# Patient Record
Sex: Male | Born: 1945 | Hispanic: Yes | Marital: Married | Smoking: Former smoker
Health system: Southern US, Community
[De-identification: ages and names within clinical notes are randomized; demographics above are authoritative.]

## PROBLEM LIST (undated history)

## (undated) DIAGNOSIS — I82409 Acute embolism and thrombosis of unspecified deep veins of unspecified lower extremity: Secondary | ICD-10-CM

## (undated) DIAGNOSIS — I714 Abdominal aortic aneurysm, without rupture, unspecified: Secondary | ICD-10-CM

## (undated) DIAGNOSIS — E785 Hyperlipidemia, unspecified: Secondary | ICD-10-CM

## (undated) DIAGNOSIS — C649 Malignant neoplasm of unspecified kidney, except renal pelvis: Secondary | ICD-10-CM

## (undated) HISTORY — DX: Abdominal aortic aneurysm, without rupture, unspecified: I71.40

## (undated) HISTORY — PX: LAPAROSCOPIC PARTIAL NEPHRECTOMY: SUR782

## (undated) HISTORY — DX: Hyperlipidemia, unspecified: E78.5

## (undated) HISTORY — DX: Acute embolism and thrombosis of unspecified deep veins of unspecified lower extremity: I82.409

## (undated) HISTORY — DX: Abdominal aortic aneurysm, without rupture: I71.4

## (undated) HISTORY — DX: Malignant neoplasm of unspecified kidney, except renal pelvis: C64.9

---

## 2017-01-31 ENCOUNTER — Encounter (HOSPITAL_COMMUNITY): Payer: Self-pay | Admitting: Emergency Medicine

## 2017-01-31 ENCOUNTER — Emergency Department (HOSPITAL_COMMUNITY)
Admission: EM | Admit: 2017-01-31 | Discharge: 2017-02-01 | Disposition: A | Discharge status: Home / Self Care | Payer: Medicare (Managed Care) | Attending: Physician Assistant | Admitting: Physician Assistant

## 2017-01-31 DIAGNOSIS — I10 Essential (primary) hypertension: Secondary | ICD-10-CM | POA: Diagnosis not present

## 2017-01-31 NOTE — ED Triage Notes (Signed)
Patient states that he has had episodes of hypertension in the past, states that he had to pick his wife off the floor this morning and he was afraid that his blood pressure has gotten high again.  He states that he wants it checked just in case.

## 2017-01-31 NOTE — ED Provider Notes (Signed)
[facility] DEPT Provider Note   CSN: [id number] Arrival date & time: 01/31/17  1850     History   Chief Complaint Chief Complaint  Patient presents with  . Hypertension    HPI Danny Torres is a 71 y.o. male.  HPI   Patient 71 year old male presenting with concerns about his blood pressure. Patient is here with his wife. He asked to have his blood pressure pressure checked in order to do so they checked him in as a patient. Patient is concerned because he has high blood pressure and he felt like he was lifting earlier today and wanted to check his blood pressure. Patient has an appointment at [facility] on Wednesday. He has no symptoms no chest pain no headaches NO need for work up for asymptomatic htn at this time.   History reviewed. No pertinent past medical history.  There are no active problems to display for this patient.   History reviewed. No pertinent surgical history.     Home Medications    Prior to Admission medications   Not on File    Family History No family history on file.  Social History Social History  Substance Use Topics  . Smoking status: Never Smoker  . Smokeless tobacco: Never Used  . Alcohol use No     Allergies   Patient has no known allergies.   Review of Systems Review of Systems  Constitutional: Negative for activity change.  Respiratory: Negative for shortness of breath.   Cardiovascular: Negative for chest pain.  Gastrointestinal: Negative for abdominal pain.     Physical Exam Updated Vital Signs BP (!) 151/71 (BP Location: Left Arm)   Pulse 65   Temp 97.7 F (36.5 C) (Oral)   Resp 18   Ht 5\' 11"  (1.803 m)   Wt 86.2 kg (190 lb)   SpO2 100%   BMI 26.50 kg/m   Physical Exam  Constitutional: He is oriented to person, place, and time. He appears well-nourished.  HENT:  Head: Normocephalic.  Eyes: Conjunctivae are normal.  Cardiovascular: Normal rate and regular rhythm.   No murmur heard. Pulmonary/Chest:  Effort normal and breath sounds normal. No respiratory distress. He has no wheezes.  Neurological: He is oriented to person, place, and time.  Skin: Skin is warm and dry. He is not diaphoretic.  Psychiatric: He has a normal mood and affect. His behavior is normal.     ED Treatments / Results  Labs (all labs ordered are listed, but only abnormal results are displayed) Labs Reviewed - No data to display  EKG  EKG Interpretation None       Radiology No results found.  Procedures Procedures (including critical care time)  Medications Ordered in ED Medications - No data to display   Initial Impression / Assessment and Plan / ED Course  I have reviewed the triage vital signs and the nursing notes.  Pertinent labs & imaging results that were available during my care of the patient were reviewed by me and considered in my medical decision making (see chart for details).     Patient 71 year old male presenting with concerns about his blood pressure. Patient is here with his wife. He asked to have his blood pressure pressure checked in order to do so they checked him in as a patient. Patient is concerned because he has high blood pressure and he felt like he was lifting earlier today and wanted to check his blood pressure. Patient has an appointment at [facility] on Wednesday. He  has no symptoms no chest pain no headaches NO need for work up for asymptomatic htn at this time.   Final Clinical Impressions(s) / ED Diagnoses   Final diagnoses:  None    New Prescriptions New Prescriptions   No medications on file     Macarthur Critchley, MD 01/31/17 2200

## 2017-02-01 NOTE — Discharge Instructions (Signed)
Please follow up with the [facility] as planned. Return with chest pain, headache or other concerns.

## 2017-04-22 ENCOUNTER — Other Ambulatory Visit (INDEPENDENT_AMBULATORY_CARE_PROVIDER_SITE_OTHER): Payer: Medicare Other

## 2017-04-22 ENCOUNTER — Ambulatory Visit (INDEPENDENT_AMBULATORY_CARE_PROVIDER_SITE_OTHER): Payer: Medicare Other | Admitting: Pulmonary Disease

## 2017-04-22 ENCOUNTER — Encounter: Payer: Self-pay | Admitting: Pulmonary Disease

## 2017-04-22 VITALS — BP 140/84 | HR 74 | Ht 69.0 in | Wt 196.0 lb

## 2017-04-22 DIAGNOSIS — R918 Other nonspecific abnormal finding of lung field: Secondary | ICD-10-CM | POA: Insufficient documentation

## 2017-04-22 DIAGNOSIS — R0600 Dyspnea, unspecified: Secondary | ICD-10-CM | POA: Diagnosis not present

## 2017-04-22 DIAGNOSIS — R0602 Shortness of breath: Secondary | ICD-10-CM

## 2017-04-22 LAB — CBC WITH DIFFERENTIAL/PLATELET
BASOS ABS: 0 10*3/uL (ref 0.0–0.1)
BASOS PCT: 0.7 % (ref 0.0–3.0)
EOS ABS: 0.2 10*3/uL (ref 0.0–0.7)
Eosinophils Relative: 2.3 % (ref 0.0–5.0)
HEMATOCRIT: 43.2 % (ref 39.0–52.0)
HEMOGLOBIN: 14.6 g/dL (ref 13.0–17.0)
LYMPHS PCT: 25.9 % (ref 12.0–46.0)
Lymphs Abs: 1.9 10*3/uL (ref 0.7–4.0)
MCHC: 33.7 g/dL (ref 30.0–36.0)
MCV: 88.8 fl (ref 78.0–100.0)
MONO ABS: 0.6 10*3/uL (ref 0.1–1.0)
Monocytes Relative: 7.7 % (ref 3.0–12.0)
NEUTROS ABS: 4.7 10*3/uL (ref 1.4–7.7)
Neutrophils Relative %: 63.4 % (ref 43.0–77.0)
PLATELETS: 259 10*3/uL (ref 150.0–400.0)
RBC: 4.87 Mil/uL (ref 4.22–5.81)
RDW: 12.4 % (ref 11.5–15.5)
WBC: 7.4 10*3/uL (ref 4.0–10.5)

## 2017-04-22 LAB — NITRIC OXIDE: Nitric Oxide: 25

## 2017-04-22 NOTE — Patient Instructions (Signed)
We will schedule pulmonary function test Get CBC with differential and blood allergy profile Follow-up in 2-4 weeks for review and discuss further management.
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
## 2017-04-23 LAB — RESPIRATORY ALLERGY PROFILE REGION II ~~LOC~~
Allergen, A. alternata, m6: <0.1 kU/L
Allergen, Cedar tree, t12: <0.1 kU/L
Allergen, Comm Silver Birch, t9: <0.1 kU/L
Allergen, D pternoyssinus,d7: 0.13 kU/L — ABNORMAL HIGH
Allergen, Mulberry, t76: <0.1 kU/L
Allergen, P. notatum, m1: 0.11 kU/L — ABNORMAL HIGH
Aspergillus fumigatus, m3: <0.1 kU/L
CLASS: 0
CLASS: 0
CLASS: 0
CLASS: 0
CLASS: 0
CLASS: 0
CLASS: 0
CLASS: 0
CLASS: 0
CLASS: 0
CLASS: 0
Cat Dander: <0.1 kU/L
Class: 0
Class: 0
Class: 0
Class: 0
Class: 0
Class: 0
Class: 0
Class: 0
Class: 0
Class: 0
Class: 0
Class: 0
Class: 0
D. farinae: <0.1 kU/L
IgE (Immunoglobulin E), Serum: 42 kU/L (ref <=114)
Pecan/Hickory Tree IgE: <0.1 kU/L
Rough Pigweed  IgE: <0.1 kU/L

## 2017-04-23 LAB — INTERPRETATION:

## 2017-04-23 NOTE — Addendum Note (Signed)
Addended by: Maryanna Shape A on: 04/23/2017 10:54 AM   Modules accepted: Orders

## 2017-07-05 ENCOUNTER — Ambulatory Visit: Payer: Medicare Other | Admitting: Pulmonary Disease

## 2017-09-28 ENCOUNTER — Encounter: Payer: Self-pay | Admitting: Pulmonary Disease

## 2017-09-28 ENCOUNTER — Ambulatory Visit: Payer: Medicare Other | Admitting: Pulmonary Disease

## 2017-09-28 VITALS — BP 120/64 | HR 77 | Ht 70.0 in | Wt 197.8 lb

## 2017-09-28 DIAGNOSIS — R0600 Dyspnea, unspecified: Secondary | ICD-10-CM

## 2017-09-28 DIAGNOSIS — R918 Other nonspecific abnormal finding of lung field: Secondary | ICD-10-CM | POA: Diagnosis not present

## 2017-09-28 MED ORDER — UMECLIDINIUM-VILANTEROL 62.5-25 MCG/INH IN AEPB: 1.0000 | INHALATION_SPRAY | Freq: Every day | RESPIRATORY_TRACT | 5 refills | Status: DC

## 2017-09-28 MED ORDER — UMECLIDINIUM-VILANTEROL 62.5-25 MCG/INH IN AEPB: 1.0000 | INHALATION_SPRAY | Freq: Every day | RESPIRATORY_TRACT | 0 refills | Status: AC
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
## 2017-09-28 NOTE — Patient Instructions (Signed)
I have reviewed your last CT scan.  It shows small lung nodules which are likely benign We will follow-up with a repeat CT in 6 months Please see if he can get your CT scan from the [place] on a desk for me to review We will order pulmonary function test, give Anoro inhaler.  Please let us know if this works for you Follow-up in 1-2 months

## 2017-11-03 ENCOUNTER — Ambulatory Visit (INDEPENDENT_AMBULATORY_CARE_PROVIDER_SITE_OTHER): Payer: Medicare Other | Admitting: Pulmonary Disease

## 2017-11-03 ENCOUNTER — Encounter: Payer: Self-pay | Admitting: Pulmonary Disease

## 2017-11-03 VITALS — BP 136/80 | HR 71 | Ht 70.0 in | Wt 194.0 lb

## 2017-11-03 DIAGNOSIS — R0602 Shortness of breath: Secondary | ICD-10-CM | POA: Diagnosis not present

## 2017-11-03 DIAGNOSIS — R0600 Dyspnea, unspecified: Secondary | ICD-10-CM

## 2017-11-03 DIAGNOSIS — R918 Other nonspecific abnormal finding of lung field: Secondary | ICD-10-CM

## 2017-11-03 LAB — PULMONARY FUNCTION TEST
DL/VA % pred: 89 %
DL/VA: 4.14 ml/min/mmHg/L
DLCO UNC % PRED: 84 %
DLCO UNC: 27.24 ml/min/mmHg
FEF 25-75 POST: 3.59 L/s
FEF 25-75 Pre: 2.63 L/sec
FEF2575-%Change-Post: 36 %
FEF2575-%Pred-Post: 144 %
FEF2575-%Pred-Pre: 105 %
FEV1-%CHANGE-POST: 8 %
FEV1-%PRED-POST: 88 %
FEV1-%Pred-Pre: 81 %
FEV1-POST: 2.91 L
FEV1-Pre: 2.68 L
FEV1FVC-%Change-Post: 3 %
FEV1FVC-%Pred-Pre: 106 %
FEV6-%Change-Post: 3 %
FEV6-%PRED-PRE: 80 %
FEV6-%Pred-Post: 83 %
FEV6-POST: 3.52 L
FEV6-Pre: 3.39 L
FEV6FVC-%Change-Post: 0 %
FEV6FVC-%PRED-POST: 105 %
FEV6FVC-%Pred-Pre: 106 %
FVC-%CHANGE-POST: 4 %
FVC-%PRED-POST: 79 %
FVC-%PRED-PRE: 76 %
FVC-POST: 3.54 L
FVC-PRE: 3.39 L
POST FEV6/FVC RATIO: 99 %
PRE FEV6/FVC RATIO: 100 %
Post FEV1/FVC ratio: 82 %
Pre FEV1/FVC ratio: 79 %
RV % pred: 110 %
RV: 2.69 L
TLC % pred: 113 %
TLC: 7.99 L

## 2017-11-03 NOTE — Addendum Note (Signed)
Addended by: Maryanna Shape A on: 11/03/2017 11:32 AM   Modules accepted: Orders
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
## 2017-11-03 NOTE — Progress Notes (Signed)
PFT completed 11/03/17  

## 2017-11-03 NOTE — Patient Instructions (Signed)
I have reviewed your PFTs today.  They look okay with no evidence of COPD. I am glad the Anoro is helping.  We will continue the same inhaler once a day We will schedule you for follow-up CT without contrast for lung nodule follow-up in end of September 2019 I will see her back in clinic after CT scan.

## 2018-03-14 ENCOUNTER — Ambulatory Visit (INDEPENDENT_AMBULATORY_CARE_PROVIDER_SITE_OTHER)
Admission: RE | Admit: 2018-03-14 | Discharge: 2018-03-14 | Disposition: A | Discharge status: Home / Self Care | Payer: Medicare Other | Source: Ambulatory Visit | Attending: Pulmonary Disease | Admitting: Pulmonary Disease

## 2018-03-14 DIAGNOSIS — R918 Other nonspecific abnormal finding of lung field: Secondary | ICD-10-CM

## 2018-03-24 ENCOUNTER — Ambulatory Visit: Payer: Medicare Other | Admitting: Pulmonary Disease

## 2018-03-24 ENCOUNTER — Encounter: Payer: Self-pay | Admitting: Pulmonary Disease

## 2018-03-24 VITALS — BP 128/68 | HR 69 | Ht 70.0 in | Wt 194.0 lb

## 2018-03-24 DIAGNOSIS — R918 Other nonspecific abnormal finding of lung field: Secondary | ICD-10-CM

## 2018-03-24 DIAGNOSIS — R0600 Dyspnea, unspecified: Secondary | ICD-10-CM

## 2018-03-24 DIAGNOSIS — Z23 Encounter for immunization: Secondary | ICD-10-CM

## 2018-03-24 NOTE — Progress Notes (Signed)
Inhaler training given. In-check peak flow #:55

## 2018-03-24 NOTE — Patient Instructions (Signed)
I have reviewed your most recent CT.  It shows that the inflammatory opacities from the [facility] have resolved There are small calcified lung nodules that are likely benign We will order a follow-up CT without contrast in 2 years time Follow back with you in 2 years for review of scan  I am glad that your breathing has improved It is okay to stop the Anoro inhaler If there is any worsening of symptoms give [place] a call back and we will see you sooner
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
## 2018-08-15 ENCOUNTER — Other Ambulatory Visit: Payer: Self-pay | Admitting: Family Medicine

## 2018-08-15 DIAGNOSIS — C641 Malignant neoplasm of right kidney, except renal pelvis: Secondary | ICD-10-CM

## 2018-08-26 ENCOUNTER — Ambulatory Visit
Admission: RE | Admit: 2018-08-26 | Discharge: 2018-08-26 | Disposition: A | Discharge status: Home / Self Care | Payer: Medicare Other | Source: Ambulatory Visit | Attending: Family Medicine | Admitting: Family Medicine

## 2018-08-26 DIAGNOSIS — C641 Malignant neoplasm of right kidney, except renal pelvis: Secondary | ICD-10-CM

## 2018-08-26 MED ORDER — IOPAMIDOL (ISOVUE-300) INJECTION 61%
100.0000 mL | Freq: Once | INTRAVENOUS | Status: AC | PRN
  Administered 2018-08-26: 100 mL via INTRAVENOUS

## 2019-08-12 ENCOUNTER — Ambulatory Visit: Payer: Medicare Other | Attending: Internal Medicine

## 2019-08-12 DIAGNOSIS — Z23 Encounter for immunization: Secondary | ICD-10-CM | POA: Insufficient documentation

## 2019-08-12 NOTE — Progress Notes (Signed)
   Covid-19 Vaccination Clinic  Name:  Danny Torres    MRN: [id number] DOB: January 20, 1946  08/12/2019  Mr. Danny Torres was observed post Covid-19 immunization for 15 minutes without incidence. He was provided with Vaccine Information Sheet and instruction to access the V-Safe system.   Mr. Danny Torres was instructed to call 911 with any severe reactions post vaccine: Marland Kitchen Difficulty breathing  . Swelling of your face and throat  . A fast heartbeat  . A bad rash all over your body  . Dizziness and weakness    Immunizations Administered    Name Date Dose VIS Date Route   Pfizer COVID-19 Vaccine 08/12/2019 10:37 AM 0.3 mL 06/02/2019 Intramuscular   Manufacturer: [place]   Lot: [id number]   [place]: [id number]

## 2019-09-05 ENCOUNTER — Ambulatory Visit: Payer: Medicare Other | Attending: Internal Medicine

## 2019-09-05 DIAGNOSIS — Z23 Encounter for immunization: Secondary | ICD-10-CM

## 2019-09-05 NOTE — Progress Notes (Signed)
   Covid-19 Vaccination Clinic  Name:  Jakobee Snyder    MRN: [id number] DOB: Jan 12, 1946  09/05/2019  Mr. Essey Bressler was observed post Covid-19 immunization for 15 minutes without incident. He was provided with Vaccine Information Sheet and instruction to access the V-Safe system.   Mr. Fines Suhre was instructed to call 911 with any severe reactions post vaccine: Marland Kitchen Difficulty breathing  . Swelling of face and throat  . A fast heartbeat  . A bad rash all over body  . Dizziness and weakness   Immunizations Administered    Name Date Dose VIS Date Route   Pfizer COVID-19 Vaccine 09/05/2019 11:10 AM 0.3 mL 06/02/2019 Intramuscular   Manufacturer: [place]   Lot: [id number]   [place]: [id number]

## 2019-09-18 ENCOUNTER — Other Ambulatory Visit: Payer: Self-pay | Admitting: Family Medicine

## 2019-09-18 DIAGNOSIS — C641 Malignant neoplasm of right kidney, except renal pelvis: Secondary | ICD-10-CM

## 2019-10-09 ENCOUNTER — Other Ambulatory Visit: Payer: Self-pay | Admitting: Family Medicine

## 2019-10-09 DIAGNOSIS — C641 Malignant neoplasm of right kidney, except renal pelvis: Secondary | ICD-10-CM

## 2019-10-13 ENCOUNTER — Ambulatory Visit
Admission: RE | Admit: 2019-10-13 | Discharge: 2019-10-13 | Disposition: A | Discharge status: Home / Self Care | Payer: Medicare Other | Source: Ambulatory Visit | Attending: Family Medicine | Admitting: Family Medicine

## 2019-10-13 ENCOUNTER — Other Ambulatory Visit: Payer: Medicare Other

## 2019-10-13 ENCOUNTER — Other Ambulatory Visit: Payer: Self-pay

## 2019-10-13 DIAGNOSIS — C641 Malignant neoplasm of right kidney, except renal pelvis: Secondary | ICD-10-CM

## 2019-10-13 MED ORDER — IOPAMIDOL (ISOVUE-300) INJECTION 61%
100.0000 mL | Freq: Once | INTRAVENOUS | Status: AC | PRN
  Administered 2019-10-13: 100 mL via INTRAVENOUS

## 2020-02-13 ENCOUNTER — Ambulatory Visit: Payer: Medicare Other | Admitting: Pulmonary Disease

## 2020-02-13 ENCOUNTER — Other Ambulatory Visit: Payer: Self-pay

## 2020-02-13 ENCOUNTER — Encounter: Payer: Self-pay | Admitting: Pulmonary Disease

## 2020-02-13 VITALS — BP 132/68 | HR 64 | Temp 97.3°F | Ht 71.0 in | Wt 176.0 lb

## 2020-02-13 DIAGNOSIS — R918 Other nonspecific abnormal finding of lung field: Secondary | ICD-10-CM | POA: Diagnosis not present

## 2020-02-13 NOTE — Patient Instructions (Signed)
We will get a CT chest without contrast for follow-up of lung nodules You will not need a follow-up appointment unless the CT is abnormal.
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
## 2020-02-13 NOTE — Addendum Note (Signed)
Addended by: Merrilee Seashore on: 02/13/2020 03:14 PM   Modules accepted: Orders

## 2020-02-21 ENCOUNTER — Other Ambulatory Visit: Payer: Medicare Other

## 2020-02-23 ENCOUNTER — Inpatient Hospital Stay: Admission: RE | Admit: 2020-02-23 | Payer: Medicare Other | Source: Ambulatory Visit

## 2020-03-21 ENCOUNTER — Other Ambulatory Visit: Payer: Self-pay

## 2020-03-21 ENCOUNTER — Ambulatory Visit: Payer: Medicare Other | Admitting: Cardiology

## 2020-03-21 ENCOUNTER — Encounter: Payer: Self-pay | Admitting: Cardiology

## 2020-03-21 VITALS — BP 136/64 | HR 70 | Ht 71.0 in | Wt 174.0 lb

## 2020-03-21 DIAGNOSIS — I7143 Infrarenal abdominal aortic aneurysm, without rupture: Secondary | ICD-10-CM

## 2020-03-21 DIAGNOSIS — I83812 Varicose veins of left lower extremities with pain: Secondary | ICD-10-CM

## 2020-03-21 DIAGNOSIS — M79605 Pain in left leg: Secondary | ICD-10-CM

## 2020-03-21 DIAGNOSIS — I8390 Asymptomatic varicose veins of unspecified lower extremity: Secondary | ICD-10-CM

## 2020-03-21 DIAGNOSIS — E7800 Pure hypercholesterolemia, unspecified: Secondary | ICD-10-CM

## 2020-03-21 DIAGNOSIS — Z87891 Personal history of nicotine dependence: Secondary | ICD-10-CM

## 2020-03-21 DIAGNOSIS — R9431 Abnormal electrocardiogram [ECG] [EKG]: Secondary | ICD-10-CM

## 2020-03-21 NOTE — Progress Notes (Signed)
Date:  03/21/2020   ID:  Danny Torres, DOB Sep 09, 1945, MRN [id number]  PCP:  Orpah Melter, MD  Cardiologist:  Rex Kras, DO, [facility] (established care 03/21/2020)  REASON FOR CONSULT: Leg Pain   REQUESTING PHYSICIAN:  Orpah Melter, [place],   [id number]  Chief Complaint  Patient presents with  . Leg Pain    HPI  Danny Torres is a 74 y.o. male who presents to the office with a chief complaint of " leg pain." Patient's past medical history and cardiovascular risk factors include: Hx of renal cancer s/p partial nephrectomy, aortic atherosclerosis, infrarenal AAA (seen on CT abdomen and pelvis 10/13/2019), hyperlipidemia, former smoker, advanced age.  He is referred to the office at the request of Orpah Melter, MD for evaluation of evaluation of leg pain.  Patient states that he has been having lower extremity leg pain bilaterally for some time.  The pain is worse on the left compared to the right.  Patient states that he has good functional capacity for age and walks 10 to 12 miles per week.  Very infrequently does he have pain with walking.  Majority of the pain in the bilateral lower extremity happens with prolonged immobilization such as sitting down to watch TV.  He has varicose veins on the left lower extremity that are present for the last several years.  And they are painful at times.  He has not had any vein work done in the past.  No known history of peripheral vascular disease.  He does have aortic atherosclerosis, history of smoking, and hyperlipidemia.  FUNCTIONAL STATUS: Walks 10-12 miles per week.    ALLERGIES: No Known Allergies  MEDICATION LIST PRIOR TO VISIT: Current Meds  Medication Sig  . Ascorbic Acid (VITAMIN C) 100 MG tablet Take 100 mg by mouth daily.  Marland Kitchen aspirin 81 MG tablet Take 81 mg by mouth daily.  Marland Kitchen buPROPion (WELLBUTRIN XL) 300 MG 24 hr tablet Take 300 mg by mouth daily.  . busPIRone (BUSPAR) 15  MG tablet Take 15 mg by mouth 3 (three) times daily.  Marland Kitchen donepezil (ARICEPT) 10 MG tablet Take 10 mg by mouth daily.  . Multiple Vitamin (MULTIVITAMIN) tablet Take 1 tablet by mouth daily.  . Omega-3 Fatty Acids (FISH OIL) 1000 MG CAPS Take by mouth.  . simvastatin (ZOCOR) 80 MG tablet Take 80 mg by mouth daily.  . temazepam (RESTORIL) 30 MG capsule Take 30 mg by mouth at bedtime.  . Zinc Sulfate (ZINC 15 PO) Take by mouth.     PAST MEDICAL HISTORY: Past Medical History:  Diagnosis Date  . AAA (abdominal aortic aneurysm) ([place])   . Cancer of kidney ([place])   . Hyperlipidemia     PAST SURGICAL HISTORY: Past Surgical History:  Procedure Laterality Date  . LAPAROSCOPIC PARTIAL NEPHRECTOMY Right     FAMILY HISTORY: The patient family history includes Colon cancer in his father; Skin cancer in his brother.  SOCIAL HISTORY:  The patient  reports that he has quit smoking. His smoking use included cigarettes. He has a 10.00 pack-year smoking history. He has never used smokeless tobacco. He reports that he does not drink alcohol and does not use drugs.  REVIEW OF SYSTEMS: Review of Systems  Constitutional: Negative for chills and fever.  HENT: Negative for hoarse voice and nosebleeds.   Eyes: Negative for discharge, double vision and pain.  Cardiovascular: Negative for chest pain, claudication, dyspnea on exertion, leg  swelling, near-syncope, orthopnea, palpitations, paroxysmal nocturnal dyspnea and syncope.       Leg pain.   Respiratory: Negative for hemoptysis and shortness of breath.   Musculoskeletal: Negative for muscle cramps and myalgias.  Gastrointestinal: Negative for abdominal pain, constipation, diarrhea, hematemesis, hematochezia, melena, nausea and vomiting.  Neurological: Negative for dizziness and light-headedness.   PHYSICAL EXAM: Vitals with BMI 03/21/2020 02/13/2020 03/24/2018  Height 5' 11"  5' 11"  5' 10"   Weight 174 lbs 176 lbs 194 lbs  BMI 24.28 34.19 62.22  Systolic  979 892 119  Diastolic 64 68 68  Pulse 70 64 69   CONSTITUTIONAL: Well-developed and well-nourished. No acute distress.  SKIN: Skin is warm and dry. No rash noted. No cyanosis. No pallor. No jaundice HEAD: Normocephalic and atraumatic.  EYES: No scleral icterus MOUTH/THROAT: Moist oral membranes.  NECK: No JVD present. No thyromegaly noted. No carotid bruits  LYMPHATIC: No visible cervical adenopathy.  CHEST Normal respiratory effort. No intercostal retractions  LUNGS: Clear to auscultation bilaterally.  No stridor. No wheezes. No rales.  CARDIOVASCULAR: Regular, positive S1-S2, no murmurs rubs or gallops appreciated. ABDOMINAL: Soft, nontender, nondistended, positive bowel sounds in all 4 quadrants, prior surgical scars well-healed.  No apparent ascites.  EXTREMITIES: No peripheral edema.  2+ bilateral dorsalis pedis and posterior tibial pulses.  +1 bilateral popliteal pulses.  Varicose veins noted on the left lower extremity. HEMATOLOGIC: No significant bruising NEUROLOGIC: Oriented to person, place, and time. Nonfocal. Normal muscle tone.  PSYCHIATRIC: Normal mood and affect. Normal behavior. Cooperative  CARDIAC DATABASE: EKG: 03/21/2020: Sinus  Rhythm, 72bpm, Left axis, LAFB, ST depressions in inferior leads, nonspecific T-abnormality. No prior studies for comparison.   Echocardiogram: No results found for this or any previous visit from the past 1095 days.   Stress Testing: No results found for this or any previous visit from the past 1095 days.  Heart Catheterization: None  CT Abdomen pelvis with contrast 10/13/2019: Stable 3 cm infrarenal abdominal aortic aneurysm. Recommend followup by ultrasound in 3 years.  LABORATORY DATA: External Labs: Collected: 09/15/2019 Creatinine 0.91 mg/dL. eGFR: 82 mL/min per 1.73 m Potassium 4.3 Lipid profile: Total cholesterol 125, triglycerides 80, HDL 40, LDL 88  IMPRESSION:    ICD-10-CM   1. Pain in both lower extremities   M79.604 EKG 12-Lead   M79.605 PCV LOWER ARTERIAL (BILATERAL)  2. Aneurysm of infrarenal abdominal aorta (HCC)  I71.4   3. Former smoker  Z87.891   4. Hypercholesterolemia  E78.00   5. Varicose veins of left lower extremity with pain  I83.812 PCV LOWER VENOUS US (BILATERAL)  6. Abnormal electrocardiogram  R94.31 PCV ECHOCARDIOGRAM COMPLETE    PCV MYOCARDIAL PERFUSION WO LEXISCAN    SARS-COV-2 RNA,(COVID-19) QUAL NAAT     RECOMMENDATIONS: Danny Torres is a 74 y.o. male whose past medical history and cardiac risk factors include: Hx of renal cancer s/p partial nephrectomy, aortic atherosclerosis, infrarenal AAA (seen on CT abdomen and pelvis 10/13/2019), hyperlipidemia, former smoker, advanced age.  Pain in bilateral lower extremities:  Patient does not have classic symptoms of claudication overall has good functional capacity for age.  But he has multiple cardiovascular risk factors for peripheral artery disease such as aortic atherosclerosis, former smoker, and hyperlipidemia.  We will check lower extremity arterial duplex to evaluate for peripheral vascular disease.  Varicose veins in the left lower extremity with pain:  Patient is already taking aspirin 81 mg p.o. daily and states that it helps with the discomfort.  Continue for now.  We will check lower extremity venous duplexes to evaluate for either superficial or deep venous thromboses and chronic venous insufficiency.  May consider vascular consultation based on the results for possible evaluation of venous ablation.  Infrarenal abdominal aortic aneurysm:  Findings noted on CT abdomen pelvis with contrast on October 13, 2019.  Recommend follow-up ultrasound in 3 years.  Patient is informed that he needs follow-up in 3 years.  Patient no longer smokes educated on importance of complete and continued smoking cessation.  Abnormal EKG:  Patient is noted to have normal sinus rhythm with ST depressions in the inferior  leads which are downsloping.  He has multiple cardiovascular risk factors as noted above.  He has not had a cardiac evaluation in the past and is a good exercise candidate.  Recommend an echocardiogram to evaluate for structural heart disease and LVEF and exercise nuclear stress test to evaluate for underlying functional capacity and reversible ischemia.  Mixed hyperlipidemia: Currently on statin therapy.  Most recent lipid profile reviewed.  Currently managed by primary care provider.  Former smoker: Educated on the importance of continued smoking cessation.  Independently reviewed external records provided by the patient's PCP and external labs.  Labs noted above for further reference.  FINAL MEDICATION LIST END OF ENCOUNTER: No orders of the defined types were placed in this encounter.   Current Outpatient Medications:  .  Ascorbic Acid (VITAMIN C) 100 MG tablet, Take 100 mg by mouth daily., Disp: , Rfl:  .  aspirin 81 MG tablet, Take 81 mg by mouth daily., Disp: , Rfl:  .  buPROPion (WELLBUTRIN XL) 300 MG 24 hr tablet, Take 300 mg by mouth daily., Disp: , Rfl:  .  busPIRone (BUSPAR) 15 MG tablet, Take 15 mg by mouth 3 (three) times daily., Disp: , Rfl:  .  donepezil (ARICEPT) 10 MG tablet, Take 10 mg by mouth daily., Disp: , Rfl:  .  Multiple Vitamin (MULTIVITAMIN) tablet, Take 1 tablet by mouth daily., Disp: , Rfl:  .  Omega-3 Fatty Acids (FISH OIL) 1000 MG CAPS, Take by mouth., Disp: , Rfl:  .  simvastatin (ZOCOR) 80 MG tablet, Take 80 mg by mouth daily., Disp: , Rfl:  .  temazepam (RESTORIL) 30 MG capsule, Take 30 mg by mouth at bedtime., Disp: , Rfl:  .  Zinc Sulfate (ZINC 15 PO), Take by mouth., Disp: , Rfl:   Orders Placed This Encounter  Procedures  . SARS-COV-2 RNA,(COVID-19) QUAL NAAT  . PCV MYOCARDIAL PERFUSION WO LEXISCAN  . EKG 12-Lead  . PCV ECHOCARDIOGRAM COMPLETE  . PCV LOWER VENOUS US (BILATERAL)  . PCV LOWER ARTERIAL (BILATERAL)    There are no Patient  Instructions on file for this visit.   --Continue cardiac medications as reconciled in final medication list. --Return in about 6 weeks (around 05/02/2020) for Reevaluation of symptoms and review test results. Or sooner if needed. --Continue follow-up with your primary care physician regarding the management of your other chronic comorbid conditions.  Patient's questions and concerns were addressed to his satisfaction. He voices understanding of the instructions provided during this encounter.   This note was created using a voice recognition software as a result there may be grammatical errors inadvertently enclosed that do not reflect the nature of this encounter. Every attempt is made to correct such errors.  Rex Kras, [place], [facility]  Pager: [phone] Office: [phone]

## 2020-03-25 ENCOUNTER — Ambulatory Visit (INDEPENDENT_AMBULATORY_CARE_PROVIDER_SITE_OTHER)
Admission: RE | Admit: 2020-03-25 | Discharge: 2020-03-25 | Disposition: A | Discharge status: Home / Self Care | Payer: Medicare Other | Source: Ambulatory Visit | Attending: Pulmonary Disease | Admitting: Pulmonary Disease

## 2020-03-25 ENCOUNTER — Other Ambulatory Visit: Payer: Self-pay

## 2020-03-25 DIAGNOSIS — R918 Other nonspecific abnormal finding of lung field: Secondary | ICD-10-CM | POA: Diagnosis not present

## 2020-03-26 ENCOUNTER — Other Ambulatory Visit: Payer: Self-pay

## 2020-03-26 DIAGNOSIS — M79604 Pain in right leg: Secondary | ICD-10-CM

## 2020-03-26 DIAGNOSIS — M79605 Pain in left leg: Secondary | ICD-10-CM

## 2020-03-26 DIAGNOSIS — R9431 Abnormal electrocardiogram [ECG] [EKG]: Secondary | ICD-10-CM

## 2020-03-26 DIAGNOSIS — I83812 Varicose veins of left lower extremities with pain: Secondary | ICD-10-CM

## 2020-03-27 ENCOUNTER — Other Ambulatory Visit: Payer: Self-pay

## 2020-03-27 ENCOUNTER — Ambulatory Visit: Payer: Medicare Other

## 2020-03-29 ENCOUNTER — Encounter: Payer: Self-pay | Admitting: *Deleted

## 2020-03-29 ENCOUNTER — Other Ambulatory Visit (HOSPITAL_COMMUNITY)
Admission: RE | Admit: 2020-03-29 | Discharge: 2020-03-29 | Disposition: A | Discharge status: Home / Self Care | Payer: Medicare Other | Source: Ambulatory Visit | Attending: Cardiology | Admitting: Cardiology

## 2020-03-29 DIAGNOSIS — Z01818 Encounter for other preprocedural examination: Secondary | ICD-10-CM | POA: Insufficient documentation

## 2020-03-29 DIAGNOSIS — Z20822 Contact with and (suspected) exposure to covid-19: Secondary | ICD-10-CM | POA: Insufficient documentation

## 2020-03-29 LAB — SARS CORONAVIRUS 2 (TAT 6-24 HRS): SARS Coronavirus 2: NEGATIVE

## 2020-04-01 ENCOUNTER — Ambulatory Visit: Payer: Medicare Other | Admitting: Cardiology

## 2020-04-01 ENCOUNTER — Encounter: Payer: Self-pay | Admitting: Cardiology

## 2020-04-01 ENCOUNTER — Ambulatory Visit: Payer: Medicare Other

## 2020-04-01 ENCOUNTER — Other Ambulatory Visit: Payer: Self-pay

## 2020-04-01 VITALS — BP 145/70 | HR 63 | Resp 15 | Ht 71.0 in | Wt 175.0 lb

## 2020-04-01 DIAGNOSIS — I714 Abdominal aortic aneurysm, without rupture: Secondary | ICD-10-CM

## 2020-04-01 DIAGNOSIS — M79604 Pain in right leg: Secondary | ICD-10-CM

## 2020-04-01 DIAGNOSIS — M79605 Pain in left leg: Secondary | ICD-10-CM

## 2020-04-01 DIAGNOSIS — Z87891 Personal history of nicotine dependence: Secondary | ICD-10-CM

## 2020-04-01 DIAGNOSIS — I82431 Acute embolism and thrombosis of right popliteal vein: Secondary | ICD-10-CM

## 2020-04-01 DIAGNOSIS — I7143 Infrarenal abdominal aortic aneurysm, without rupture: Secondary | ICD-10-CM

## 2020-04-01 DIAGNOSIS — I83812 Varicose veins of left lower extremities with pain: Secondary | ICD-10-CM

## 2020-04-01 MED ORDER — APIXABAN (ELIQUIS) VTE STARTER PACK (10MG AND 5MG): ORAL_TABLET | ORAL | 0 refills | Status: DC

## 2020-04-01 NOTE — Progress Notes (Signed)
Date:  04/01/2020   ID:  Danny Torres, DOB [birth year], MRN [id number]  PCP:  Danny Melter, MD  Cardiologist:  Danny Kras, DO, [facility] (established care 03/21/2020)  Date: 04/01/20 Last Office Visit: 03/21/2020  Chief Complaint  Patient presents with   Follow-up   Results    Abnormal ultrasound of the legs    HPI  Danny Torres is a 74 y.o. male who presents to the office with a chief complaint of " review test results." Patient's past medical history and cardiovascular risk factors include: Hx of renal cancer s/p partial nephrectomy, aortic atherosclerosis, infrarenal AAA (seen on CT abdomen and pelvis 10/13/2019), hyperlipidemia, former smoker, advanced age.  He is referred to the office at the request of Danny Melter, MD for evaluation of evaluation of leg pain.  Patient states that he has been having lower extremity leg pain bilaterally for some time.  The pain is worse on the left compared to the right.  Patient states that he has good functional capacity for age and walks 10 to 12 miles per week.  Very infrequently does he have pain with walking.  Majority of the pain in the bilateral lower extremity happens with prolonged immobilization such as sitting down to watch TV.  He has varicose veins on the left lower extremity that are present for the last several years.  And they are painful at times. He has not had any vein work done in the past.    Since last office visit patient had arterial and venous duplex of the lower extremities.  Patient is noted to have an acute DVT in his right lower extremity.  And therefore he was recommended to follow-up sooner than his scheduled appointment.  Patient denies any shortness of breath with walking, not tachycardic.  Venous duplex results along with images independently reviewed with the patient at today's visit.  No prior history of DVT.  No prior use of oral anticoagulation.  No recent surgeries, no prior history of GI  or intracranial bleeding.  FUNCTIONAL STATUS: Walks 10-12 miles per week.    ALLERGIES: No Known Allergies  MEDICATION LIST PRIOR TO VISIT: Current Meds  Medication Sig   Ascorbic Acid (VITAMIN C) 100 MG tablet Take 100 mg by mouth daily.   aspirin 81 MG tablet Take 81 mg by mouth daily.   buPROPion (WELLBUTRIN XL) 300 MG 24 hr tablet Take 300 mg by mouth daily.   busPIRone (BUSPAR) 15 MG tablet Take 15 mg by mouth 3 (three) times daily.   donepezil (ARICEPT) 10 MG tablet Take 10 mg by mouth daily.   Multiple Vitamin (MULTIVITAMIN) tablet Take 1 tablet by mouth daily.   Omega-3 Fatty Acids (FISH OIL) 1000 MG CAPS Take by mouth.   simvastatin (ZOCOR) 80 MG tablet Take 80 mg by mouth daily.   temazepam (RESTORIL) 30 MG capsule Take 30 mg by mouth at bedtime.   Zinc Sulfate (ZINC 15 PO) Take by mouth.     PAST MEDICAL HISTORY: Past Medical History:  Diagnosis Date   AAA (abdominal aortic aneurysm) ([place])    Cancer of kidney ([place])    Hyperlipidemia     PAST SURGICAL HISTORY: Past Surgical History:  Procedure Laterality Date   LAPAROSCOPIC PARTIAL NEPHRECTOMY Right     FAMILY HISTORY: The patient family history includes Colon cancer in his father; Skin cancer in his brother.  SOCIAL HISTORY:  The patient  reports that he has quit smoking. His smoking use included cigarettes.  He has a 10.00 pack-year smoking history. He has never used smokeless tobacco. He reports that he does not drink alcohol and does not use drugs.  REVIEW OF SYSTEMS: Review of Systems  Constitutional: Negative for chills and fever.  HENT: Negative for hoarse voice and nosebleeds.   Eyes: Negative for discharge, double vision and pain.  Cardiovascular: Negative for chest pain, claudication, dyspnea on exertion, leg swelling, near-syncope, orthopnea, palpitations, paroxysmal nocturnal dyspnea and syncope.       Leg pain.   Respiratory: Negative for hemoptysis and shortness of breath.     Musculoskeletal: Negative for muscle cramps and myalgias.  Gastrointestinal: Negative for abdominal pain, constipation, diarrhea, hematemesis, hematochezia, melena, nausea and vomiting.  Neurological: Negative for dizziness and light-headedness.   PHYSICAL EXAM: Vitals with BMI 04/01/2020 03/21/2020 02/13/2020  Height 5' 11"  5' 11"  5' 11"   Weight 175 lbs 174 lbs 176 lbs  BMI 24.42 50.15 86.82  Systolic 574 935 521  Diastolic 70 64 68  Pulse 63 70 64   CONSTITUTIONAL: Well-developed and well-nourished. No acute distress.  SKIN: Skin is warm and dry. No rash noted. No cyanosis. No pallor. No jaundice HEAD: Normocephalic and atraumatic.  EYES: No scleral icterus MOUTH/THROAT: Moist oral membranes.  NECK: No JVD present. No thyromegaly noted. No carotid bruits  LYMPHATIC: No visible cervical adenopathy.  CHEST Normal respiratory effort. No intercostal retractions  LUNGS: Clear to auscultation bilaterally.  No stridor. No wheezes. No rales.  CARDIOVASCULAR: Regular, positive S1-S2, no murmurs rubs or gallops appreciated. ABDOMINAL: Soft, nontender, nondistended, positive bowel sounds in all 4 quadrants, prior surgical scars well-healed.  No apparent ascites.  EXTREMITIES: No peripheral edema.  2+ bilateral dorsalis pedis and posterior tibial pulses.  +1 bilateral popliteal pulses.  Varicose veins noted on the left lower extremity. HEMATOLOGIC: No significant bruising NEUROLOGIC: Oriented to person, place, and time. Nonfocal. Normal muscle tone.  PSYCHIATRIC: Normal mood and affect. Normal behavior. Cooperative  CARDIAC DATABASE: EKG: 03/21/2020: Sinus  Rhythm, 72bpm, Left axis, LAFB, ST depressions in inferior leads, nonspecific T-abnormality. No prior studies for comparison.   Echocardiogram: 03/27/2020:  Left ventricle cavity is normal in size. Mild concentric hypertrophy of the left ventricle. Normal global wall motion. Normal LV systolic function with visual EF 50-55%. Doppler  evidence of grade I (impaired) diastolic  dysfunction, normal LAP.  Left atrial cavity is mildly dilated.  Moderate (Grade II) mitral regurgitation.  Mild tricuspid regurgitation. Estimated pulmonary artery systolic pressure 24 mmHg.   Stress Testing: No results found for this or any previous visit from the past 1095 days.  Heart Catheterization: None  CT Abdomen pelvis with contrast 10/13/2019: Stable 3 cm infrarenal abdominal aortic aneurysm. Recommend followup by ultrasound in 3 years.  Lower Extremity Arterial Duplex 03/27/2020:  No hemodynamically significant stenoses are identified in the bilateral lower extremity arterial system.   This exam reveals normal perfusion of the right lower extremity (ABI 1.04) and normal perfusion of the left lower extremity (ABI1.00). Evaluate for pseudo-claudication.  Lower Extremity Venous Duplex Bilateral 03/27/2020:  Totally occluding acute thrombosis of the right lower extremity with reduced venous return noted in the distal femoral vein and partially occluding right proximal popliteal vein.  No evidence of deep vein thrombosis of the left lower extremity with  normal venous return. See image.   LABORATORY DATA: External Labs: Collected: 09/15/2019 Creatinine 0.91 mg/dL. eGFR: 82 mL/min per 1.73 m Potassium 4.3 Lipid profile: Total cholesterol 125, triglycerides 80, HDL 40, LDL 88  IMPRESSION:  ICD-10-CM   1. Acute deep vein thrombosis (DVT) of popliteal vein of right lower extremity (HCC)  I82.431 APIXABAN (ELIQUIS) VTE STARTER PACK (10MG AND 5MG)    PCV LOWER VENOUS US (BILATERAL)  2. Pain in both lower extremities  M79.604    M79.605   3. Varicose veins of left lower extremity with pain  I83.812   4. Aneurysm of infrarenal abdominal aorta (HCC)  I71.4   5. Former smoker  Z87.891      RECOMMENDATIONS: Eulas Schweitzer is a 74 y.o. male whose past medical history and cardiac risk factors include: Hx of renal cancer s/p  partial nephrectomy, aortic atherosclerosis, infrarenal AAA (seen on CT abdomen and pelvis 10/13/2019), hyperlipidemia, right lower extremity DVT, former smoker, advanced age.  Acute right lower extremity DVT:  In the evaluation of his bilateral lower extremity pain patient did undergo arterial and venous duplex of the lower extremities.  Patient's ABI are within normal limits and arterial duplex does not show any significant stenosis.  The venous duplex did report an acute DVT in the right lower extremity.  This is a new finding for the patient.  He denies any symptoms of pulmonary embolism.  He is made aware of the symptoms of pulmonary embolism and to seek medical attention if they surface.  As noted above no prior history of gastrointestinal or intracranial bleeding.  Start oral anticoagulation for 3 months and repeat lower extremity venous duplex.  Risks, benefits, and alternatives to oral anticoagulation discussed.  Patient is asked to seek medical attention if he has any evidence of bleeding or sustained an injury despite the mechanism of injury by going to the closest ER for further evaluation and management.  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 daily.  Infrarenal abdominal aortic aneurysm:  Findings noted on CT abdomen pelvis with contrast on October 13, 2019.  Recommend follow-up ultrasound in 3 years.  Patient is informed that he needs follow-up in 3 years.  Patient no longer smokes educated on importance of complete and continued smoking cessation.  Abnormal EKG:  Patient is noted to have normal sinus rhythm with ST depressions in the inferior leads which are downsloping.  He has multiple cardiovascular risk factors as noted above.  He has not had a cardiac evaluation in the past and is a good exercise candidate.  Echocardiogram results reviewed with the patient.  Patient is also scheduled for a nuclear stress test later today.  Results  forthcoming.    Mixed hyperlipidemia: Currently on statin therapy.  Most recent lipid profile reviewed.  Currently managed by primary care provider.  Former smoker: Educated on the importance of continued smoking cessation.  FINAL MEDICATION LIST END OF ENCOUNTER: Meds ordered this encounter  Medications   APIXABAN (ELIQUIS) VTE STARTER PACK (10MG AND 5MG)    Sig: Take as directed on package: start with two-2m tablets twice daily for 7 days. On day 8, switch to one-549mtablet twice daily.    Dispense:  1 each    Refill:  0    Current Outpatient Medications:    Ascorbic Acid (VITAMIN C) 100 MG tablet, Take 100 mg by mouth daily., Disp: , Rfl:    aspirin 81 MG tablet, Take 81 mg by mouth daily., Disp: , Rfl:    buPROPion (WELLBUTRIN XL) 300 MG 24 hr tablet, Take 300 mg by mouth daily., Disp: , Rfl:    busPIRone (BUSPAR) 15 MG tablet, Take 15 mg by  mouth 3 (three) times daily., Disp: , Rfl:    donepezil (ARICEPT) 10 MG tablet, Take 10 mg by mouth daily., Disp: , Rfl:    Multiple Vitamin (MULTIVITAMIN) tablet, Take 1 tablet by mouth daily., Disp: , Rfl:    Omega-3 Fatty Acids (FISH OIL) 1000 MG CAPS, Take by mouth., Disp: , Rfl:    simvastatin (ZOCOR) 80 MG tablet, Take 80 mg by mouth daily., Disp: , Rfl:    temazepam (RESTORIL) 30 MG capsule, Take 30 mg by mouth at bedtime., Disp: , Rfl:    Zinc Sulfate (ZINC 15 PO), Take by mouth., Disp: , Rfl:    APIXABAN (ELIQUIS) VTE STARTER PACK (10MG AND 5MG), Take as directed on package: start with two-29m tablets twice daily for 7 days. On day 8, switch to one-532mtablet twice daily., Disp: 1 each, Rfl: 0  Orders Placed This Encounter  Procedures   PCV LOWER VENOUS [place])    There are no Patient Instructions on file for this visit.   --Continue cardiac medications as reconciled in final medication list. --Keep her scheduled appointment for May 03, 2020, or sooner if needed. --Continue follow-up with your primary  care physician regarding the management of your other chronic comorbid conditions.  Patient's questions and concerns were addressed to his satisfaction. He voices understanding of the instructions provided during this encounter.   This note was created using a voice recognition software as a result there may be grammatical errors inadvertently enclosed that do not reflect the nature of this encounter. Every attempt is made to correct such errors.  SuRex KrasDONevadaFAIntegris Miami HospitalPager: [phone]: [phone]

## 2020-04-02 ENCOUNTER — Other Ambulatory Visit: Payer: Self-pay

## 2020-04-02 DIAGNOSIS — I82431 Acute embolism and thrombosis of right popliteal vein: Secondary | ICD-10-CM

## 2020-04-04 ENCOUNTER — Other Ambulatory Visit: Payer: Self-pay

## 2020-04-04 ENCOUNTER — Ambulatory Visit: Payer: Medicare Other

## 2020-04-09 ENCOUNTER — Encounter (HOSPITAL_COMMUNITY): Payer: Medicare Other

## 2020-04-12 ENCOUNTER — Other Ambulatory Visit: Payer: Medicare Other

## 2020-04-13 NOTE — Progress Notes (Signed)
Spoke with both the patient and his wife over the phone and conveyed the results of the lower extremity venous duplex performed on 04/02/2020.  I informed him that the repeat lower extremity venous duplex did not show evidence of deep venous thrombosis and they can safely discontinue Eliquis.Patient still finds his varicose veins painful would like to get them addressed.  Patient is asking for referral for vein specialist I informed him that I will have the office coordinate that and reach out to him next week.

## 2020-04-18 ENCOUNTER — Telehealth: Payer: Self-pay | Admitting: Cardiology

## 2020-04-18 NOTE — Telephone Encounter (Signed)
Pt called and stated that he spoke to you on Saturday , he said that th referral for pain specialist did not call him back , so he followed up with a call to see what's gong on.

## 2020-04-23 ENCOUNTER — Other Ambulatory Visit: Payer: Self-pay | Admitting: Cardiology

## 2020-04-23 DIAGNOSIS — I82431 Acute embolism and thrombosis of right popliteal vein: Secondary | ICD-10-CM

## 2020-05-03 ENCOUNTER — Ambulatory Visit: Payer: Medicare Other | Admitting: Cardiology

## 2020-05-22 ENCOUNTER — Other Ambulatory Visit: Payer: Self-pay | Admitting: Cardiology

## 2020-05-22 NOTE — Telephone Encounter (Signed)
Please send in a referral to Dr. Wallene Dales. If needed you can ask April or Bev G for help.  RE: Vein Ablation

## 2020-05-22 NOTE — Telephone Encounter (Signed)
Hi Dr Terri Skains  There was already a referral in Epic from Dr.Thacker to [facility] from Sept. I had told the patient the referral was already in there from Dr. Sheryn Bison.  He is scheduled for 05/27/2020 with [facility].  Dr Kathlene Cote will not have anything before Feburary.

## 2020-05-23 IMAGING — CT CT ABD-PELV W/ CM
1 of 3 series · 13 of 32 positions shown, 18 images · IV contrast (APPLIED)
Comparison: None.

CLINICAL DATA: History of renal cell carcinoma, follow-up RIGHT
partial nephrectomy.

EXAM:
CT ABDOMEN AND PELVIS WITH CONTRAST
TECHNIQUE: Multidetector CT imaging of the abdomen and pelvis was performed
using the standard protocol following bolus administration of
intravenous contrast.
Creatinine was obtained on site at [HOSPITAL] at [HOSPITAL].
Results: Creatinine 1.0 mg/dL.
CONTRAST:  100mL VRDFZK-SCC IOPAMIDOL (VRDFZK-SCC) INJECTION 61%

[Series 2: abd/pelvis w/cm · axial · 0.79mm/px · z∈[-552,-162]mm · 13 of 92 slices shown, 18 images]
[im 7/92  soft-tissue]
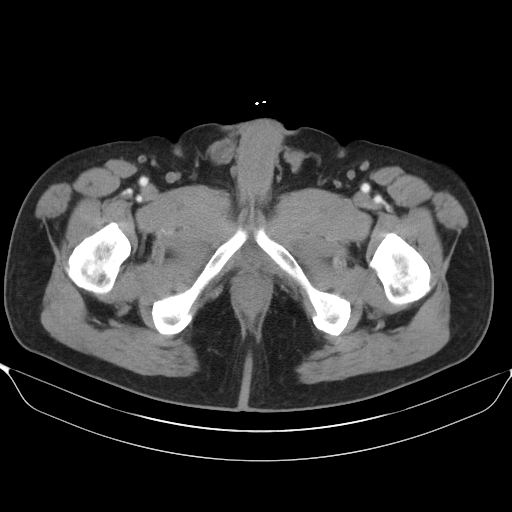
[im 7/92  bone]
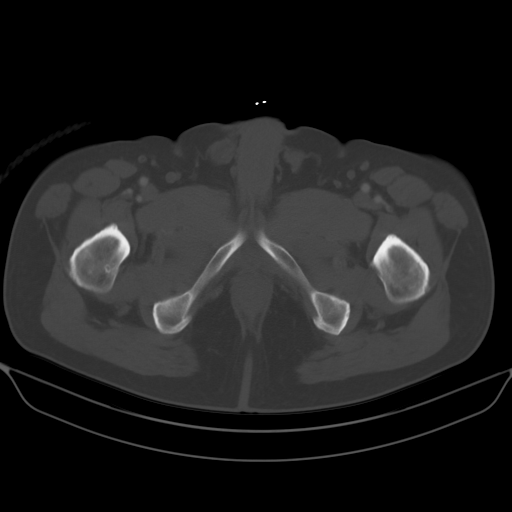
[im 13/92  soft-tissue]
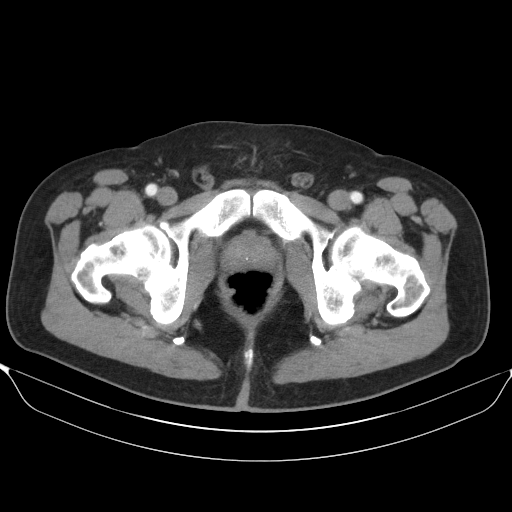
[im 19/92  soft-tissue]
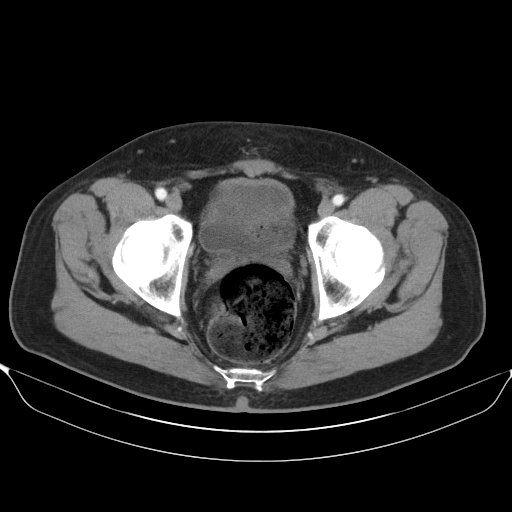
[im 31/92  soft-tissue]
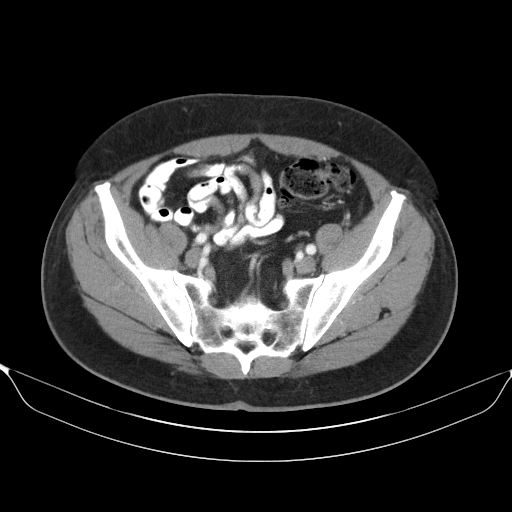
[im 37/92  soft-tissue]
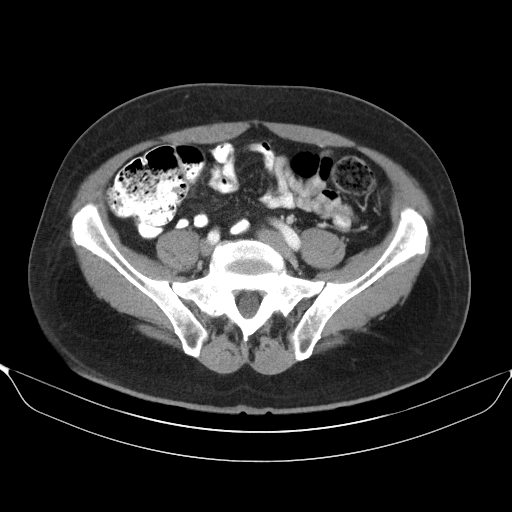
[im 43/92  soft-tissue]
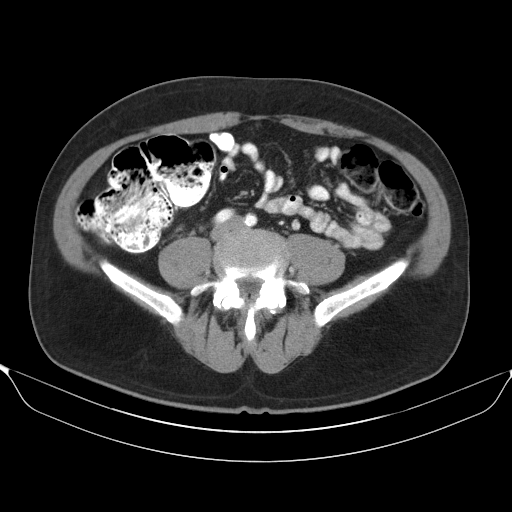
[im 49/92  soft-tissue]
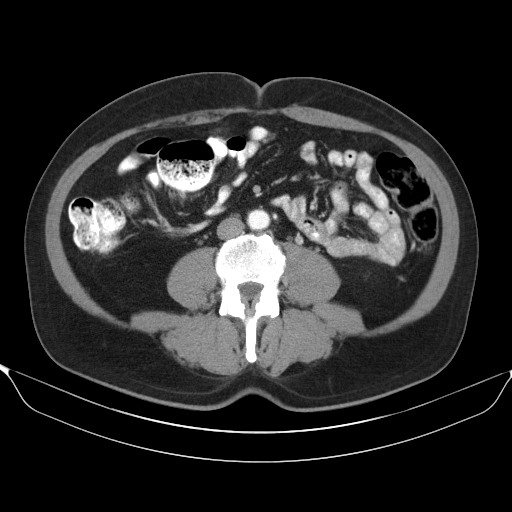
[im 55/92  soft-tissue]
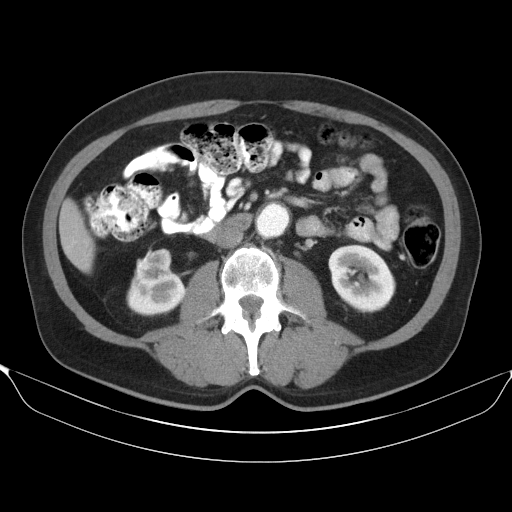
[im 61/92  soft-tissue]
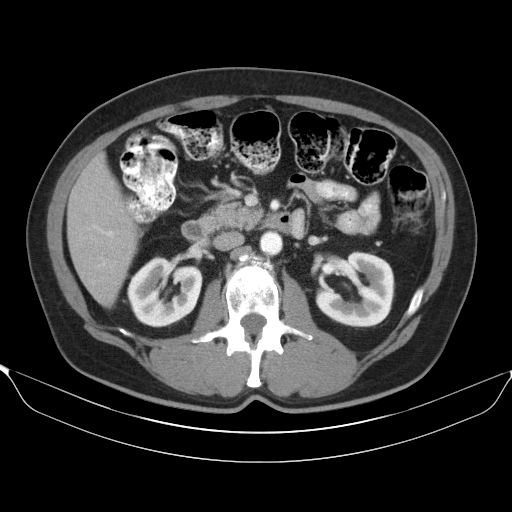
[im 61/92  bone]
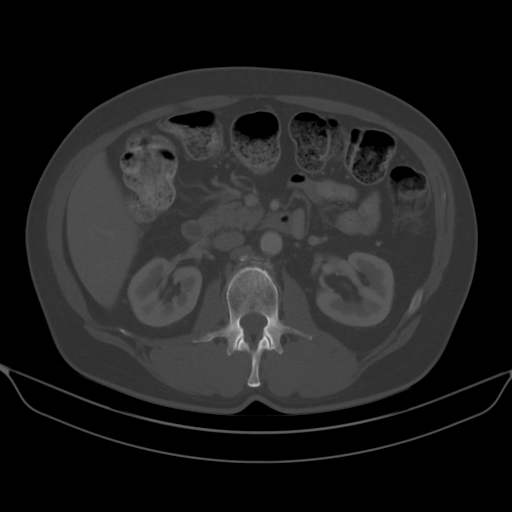
[im 67/92  lung]
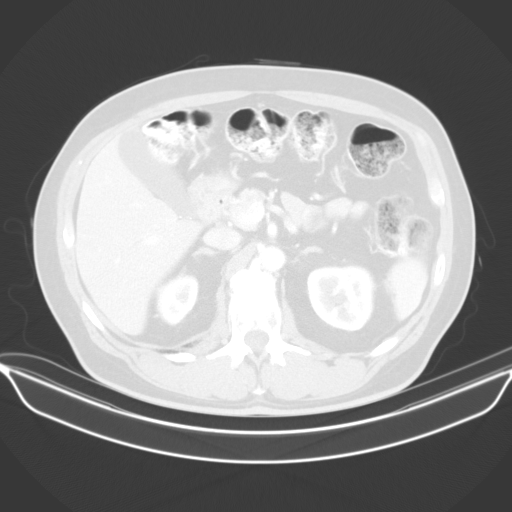
[im 73/92  soft-tissue]
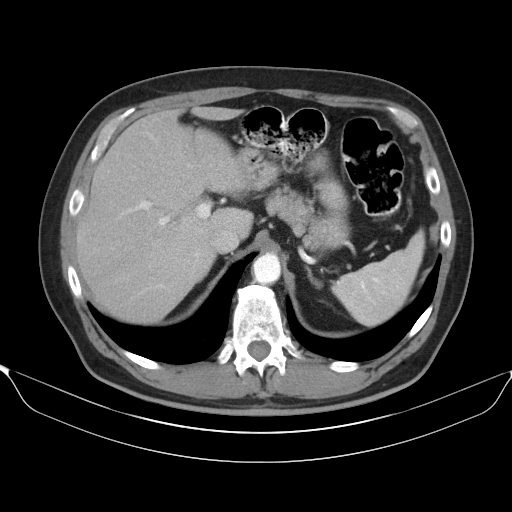
[im 73/92  lung]
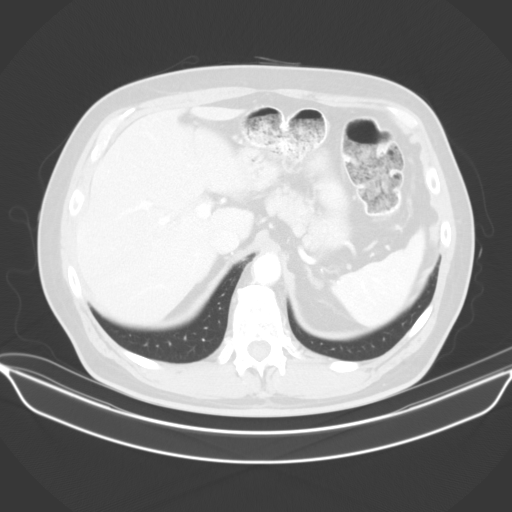
[im 79/92  soft-tissue]
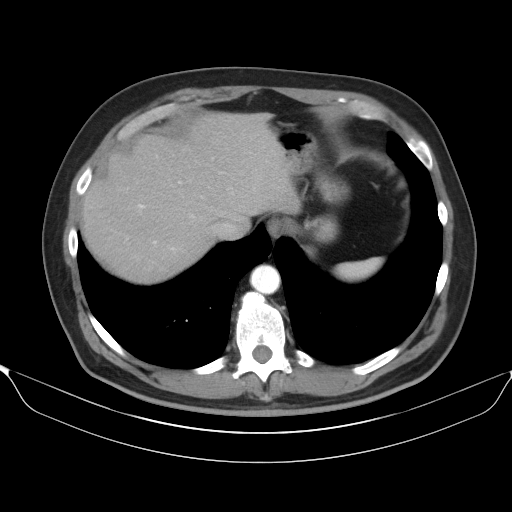
[im 79/92  lung]
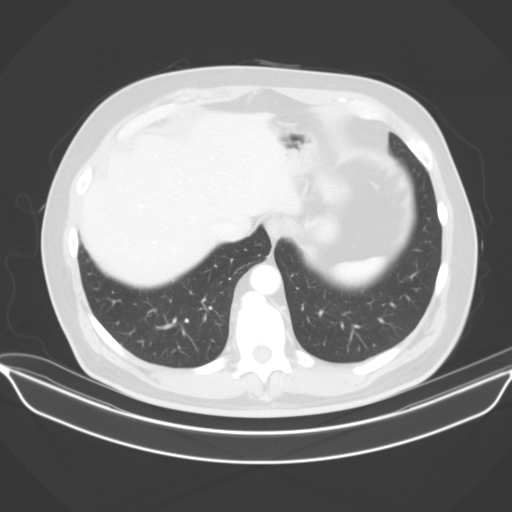
[im 85/92  soft-tissue]
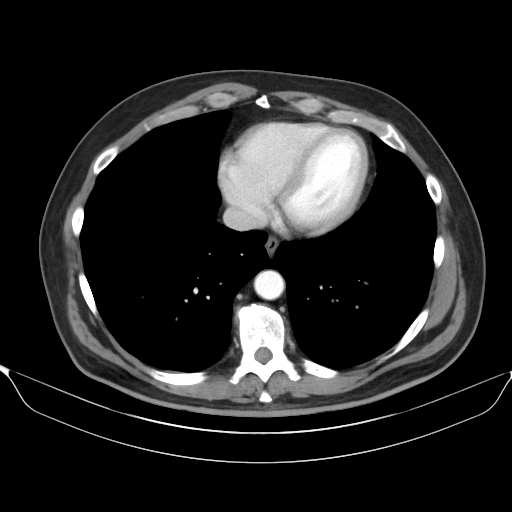
[im 85/92  lung]
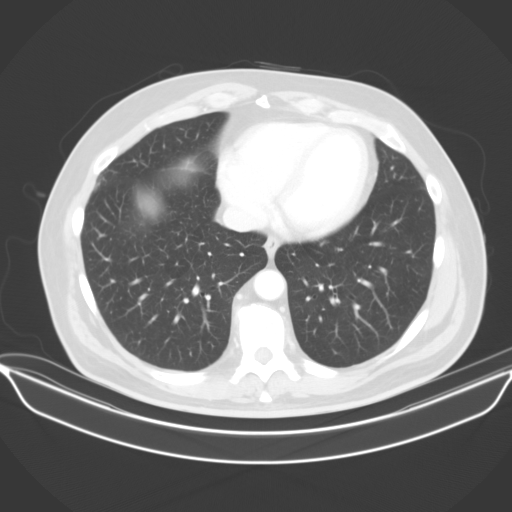

[13 of 32 positions shown; findings below may reference images not displayed]

FINDINGS: Lower chest: Incidental imaging of the lung bases is unremarkable.

Hepatobiliary: No suspicious focal hepatic lesion. Gallbladder is
normal, no sign of pericholecystic inflammation. No biliary ductal
dilation.

Pancreas: Pancreas is normal without signs of inflammation, ductal
dilation or focal lesion.

Spleen: Normal size without focal lesion.

Adrenals/Urinary Tract: Normal adrenal glands.

No noncontrast imaging is provided on today's examination. However,
the postoperative appearance of RIGHT partial nephrectomy along the
lower pole and anterior hilar lip of the RIGHT kidney show no signs
of nodularity or change that would suggest disease recurrence in
this area. Subtle variable perfusion to the anterior hilar lip on
the RIGHT is unchanged. This is best appreciated on the delayed
images. No new suspicious renal lesion.

Urinary bladder is unremarkable.

Stomach/Bowel: No acute gastrointestinal process.

Stool throughout the colon. Colonic diverticulosis.

Vascular/Lymphatic: Infrarenal abdominal aortic aneurysm with
eccentric soft plaque at 3 cm greatest caliber at the level of the
IMA origin unchanged compared to the previous exam. No
retroperitoneal adenopathy. No pelvic lymphadenopathy.

Reproductive: Calcifications in the prostate gland similar to the
prior study.

Other: No signs of flank hernia. No body wall nodularity.

Musculoskeletal: No acute bone finding no destructive bone process.
IMPRESSION: 1. Stable postoperative appearance of RIGHT partial nephrectomy
without evidence of disease recurrence in this area.
2. No evidence of metastatic disease.
3. Stable 3 cm infrarenal abdominal aortic aneurysm. Recommend
followup by ultrasound in 3 years. This recommendation follows ACR
consensus guidelines: White Paper of the ACR Incidental Findings
4. Aortic atherosclerosis.

Aortic Atherosclerosis (I392O-W99.9).

## 2020-05-25 NOTE — Telephone Encounter (Signed)
Either Dr. Sheryn Bison or Dr. Kathlene Cote is fine Mr. Fleetwood will be in great hands.

## 2020-05-27 ENCOUNTER — Ambulatory Visit (INDEPENDENT_AMBULATORY_CARE_PROVIDER_SITE_OTHER): Payer: Medicare Other | Admitting: Physician Assistant

## 2020-05-27 ENCOUNTER — Ambulatory Visit (HOSPITAL_COMMUNITY)
Admission: RE | Admit: 2020-05-27 | Discharge: 2020-05-27 | Disposition: A | Discharge status: Home / Self Care | Payer: Medicare Other | Source: Ambulatory Visit | Attending: Vascular Surgery | Admitting: Vascular Surgery

## 2020-05-27 ENCOUNTER — Other Ambulatory Visit: Payer: Self-pay

## 2020-05-27 VITALS — BP 146/79 | HR 67 | Temp 98.6°F | Resp 20 | Ht 71.0 in | Wt 178.4 lb

## 2020-05-27 DIAGNOSIS — I8390 Asymptomatic varicose veins of unspecified lower extremity: Secondary | ICD-10-CM

## 2020-05-27 DIAGNOSIS — I8393 Asymptomatic varicose veins of bilateral lower extremities: Secondary | ICD-10-CM

## 2020-05-27 NOTE — Progress Notes (Signed)
Office Note     CC:  follow up Requesting Provider:  Orpah Melter, MD  HPI: Danny Torres is a 74 y.o. 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  Patient states he is noted more prominent veins especially of left lower leg in the past couple years.  He denies any excessive swelling.  He is mostly bothered by fatigue feeling and aching pain when he is sitting still towards the end of the day.  His legs do not bother him while he is exercising or walking.  He denies claudication, nonhealing wounds, or rest pain of bilateral lower extremities.  He has worn compression in the past however believes they do not fit him correctly.  He elevates his legs periodically during the day.  He denies any history of DVT, venous ulcerations, trauma, or prior vascular interventions.  He denies tobacco use.   Past Medical History:  Diagnosis Date  . AAA (abdominal aortic aneurysm) ([place])   . Cancer of kidney ([place])   . DVT (deep venous thrombosis) ([place])   . Hyperlipidemia     Past Surgical History:  Procedure Laterality Date  . LAPAROSCOPIC PARTIAL NEPHRECTOMY Right     Social History   Socioeconomic History  . Marital status: Married    Spouse name: Not on file  . Number of children: 3  . Years of education: Not on file  . Highest education level: Not on file  Occupational History  . Not on file  Tobacco Use  . Smoking status: Former Smoker    Packs/day: 0.50    Years: 20.00    Pack years: 10.00    Types: Cigarettes  . Smokeless tobacco: Never Used  . Tobacco comment: quit smoking in 1998  Vaping Use  . Vaping Use: Never used  Substance and Sexual Activity  . Alcohol use: No  . Drug use: No  . Sexual activity: Not on file  Other Topics Concern  . Not on file  Social History Narrative  . Not on file   Social Determinants of Health   Financial Resource Strain:   . Difficulty of Paying Living Expenses: Not on file  Food Insecurity:   . Worried  About Charity fundraiser in the Last Year: Not on file  . Ran Out of Food in the Last Year: Not on file  Transportation Needs:   . Lack of Transportation (Medical): Not on file  . Lack of Transportation (Non-Medical): Not on file  Physical Activity:   . Days of Exercise per Week: Not on file  . Minutes of Exercise per Session: Not on file  Stress:   . Feeling of Stress : Not on file  Social Connections:   . Frequency of Communication with Friends and Family: Not on file  . Frequency of Social Gatherings with Friends and Family: Not on file  . Attends Religious Services: Not on file  . Active Member of Clubs or Organizations: Not on file  . Attends Archivist Meetings: Not on file  . Marital Status: Not on file  Intimate Partner Violence:   . Fear of Current or Ex-Partner: Not on file  . Emotionally Abused: Not on file  . Physically Abused: Not on file  . Sexually Abused: Not on file    Family History  Problem Relation Age of Onset  . Colon cancer Father   . Skin cancer Brother     Current Outpatient Medications  Medication Sig Dispense Refill  . Apixaban  Starter Pack, 10mg  and 5mg , (ELIQUIS DVT/PE STARTER PACK) USE AS DIRECTED 74 each 0  . Ascorbic Acid (VITAMIN C) 100 MG tablet Take 100 mg by mouth daily.    Marland Kitchen aspirin 81 MG tablet Take 81 mg by mouth daily.    Marland Kitchen buPROPion (WELLBUTRIN XL) 300 MG 24 hr tablet Take 300 mg by mouth daily.    . busPIRone (BUSPAR) 15 MG tablet Take 15 mg by mouth 3 (three) times daily.    . cyanocobalamin 100 MCG tablet Take 100 mcg by mouth daily.    Marland Kitchen donepezil (ARICEPT) 10 MG tablet Take 10 mg by mouth daily.    . Multiple Vitamin (MULTIVITAMIN) tablet Take 1 tablet by mouth daily.    . Omega-3 Fatty Acids (FISH OIL) 1000 MG CAPS Take by mouth.    . simvastatin (ZOCOR) 80 MG tablet Take 80 mg by mouth daily.    . temazepam (RESTORIL) 30 MG capsule Take 30 mg by mouth at bedtime.    . Zinc Sulfate (ZINC 15 PO) Take by mouth.      No current facility-administered medications for this visit.    No Known Allergies   REVIEW OF SYSTEMS:   [X]  denotes positive finding, [ ]  denotes negative finding Cardiac  Comments:  Chest pain or chest pressure:    Shortness of breath upon exertion:    Short of breath when lying flat:    Irregular heart rhythm:        Vascular    Pain in calf, thigh, or hip brought on by ambulation:    Pain in feet at night that wakes you up from your sleep:     Blood clot in your veins:    Leg swelling:         Pulmonary    Oxygen at home:    Productive cough:     Wheezing:         Neurologic    Sudden weakness in arms or legs:     Sudden numbness in arms or legs:     Sudden onset of difficulty speaking or slurred speech:    Temporary loss of vision in one eye:     Problems with dizziness:         Gastrointestinal    Blood in stool:     Vomited blood:         Genitourinary    Burning when urinating:     Blood in urine:        Psychiatric    Major depression:         Hematologic    Bleeding problems:    Problems with blood clotting too easily:        Skin    Rashes or ulcers:        Constitutional    Fever or chills:      PHYSICAL EXAMINATION:  Vitals:   05/27/20 1349  BP: (!) 146/79  Pulse: 67  Resp: 20  Temp: 98.6 F (37 C)  TempSrc: Temporal  SpO2: 97%  Weight: 178 lb 6.4 oz (80.9 kg)  Height: 5\' 11"  (1.803 m)    General:  WDWN in NAD; vital signs documented above Gait: Not observed HENT: WNL, normocephalic Pulmonary: normal non-labored breathing  Cardiac: regular HR Abdomen: soft, NT, no masses Skin: without rashes Vascular Exam/Pulses:  Right Left  Radial 2+ (normal) 2+ (normal)  DP 2+ (normal) 2+ (normal)   Extremities: without ischemic changes, without Gangrene , without cellulitis; without open wounds;  varicosities noted left lateral thigh which crosses below the knee anteriorly as well as left calf; no prominent varicosities right lower  extremity Musculoskeletal: no muscle wasting or atrophy  Neurologic: A&O X 3;  No focal weakness or paresthesias are detected Psychiatric:  The pt has Normal affect.   Non-Invasive Vascular Imaging:   Bilateral venous reflux study R Negative for DVT Negative for deep venous reflux Reflux noted in right greater saphenous vein from saphenofemoral junction down to the proximal calf however diameter is less than 3 mm Reflux also noted in proximal small saphenous vein  L Negative for DVT Deep venous reflux noted in common femoral vein, popliteal vein Superficial venous reflux noted at saphenofemoral junction down to proximal calf however diameter is less than 4 mm    ASSESSMENT/PLAN:: 74 y.o. 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  Despite superficial reflux bilaterally the greater saphenous vein is very small and would not be a candidate for laser ablation therapy -Patient was measured for and recommended to wear knee-high 15 to 20 mmHg compression daily -Also recommended periodic elevation, avoiding prolonged sitting and standing, and NSAIDs for discomfort associated with veins -He will follow up as needed   Dagoberto Ligas, PA-C [facility] MD:   Trula Slade

## 2020-09-11 ENCOUNTER — Ambulatory Visit: Payer: Medicare Other | Admitting: Cardiology

## 2020-10-14 DIAGNOSIS — E782 Mixed hyperlipidemia: Secondary | ICD-10-CM | POA: Diagnosis not present

## 2020-10-14 DIAGNOSIS — G8929 Other chronic pain: Secondary | ICD-10-CM | POA: Diagnosis not present

## 2020-12-30 DIAGNOSIS — E782 Mixed hyperlipidemia: Secondary | ICD-10-CM | POA: Diagnosis not present

## 2020-12-30 DIAGNOSIS — C641 Malignant neoplasm of right kidney, except renal pelvis: Secondary | ICD-10-CM | POA: Diagnosis not present

## 2020-12-30 DIAGNOSIS — G8929 Other chronic pain: Secondary | ICD-10-CM | POA: Diagnosis not present

## 2021-01-31 ENCOUNTER — Other Ambulatory Visit: Payer: Self-pay | Admitting: *Deleted

## 2021-01-31 DIAGNOSIS — I8390 Asymptomatic varicose veins of unspecified lower extremity: Secondary | ICD-10-CM

## 2021-02-07 DIAGNOSIS — I83813 Varicose veins of bilateral lower extremities with pain: Secondary | ICD-10-CM | POA: Diagnosis not present

## 2021-02-07 DIAGNOSIS — Z23 Encounter for immunization: Secondary | ICD-10-CM | POA: Diagnosis not present

## 2021-02-07 DIAGNOSIS — J8410 Pulmonary fibrosis, unspecified: Secondary | ICD-10-CM | POA: Diagnosis not present

## 2021-02-07 DIAGNOSIS — E559 Vitamin D deficiency, unspecified: Secondary | ICD-10-CM | POA: Diagnosis not present

## 2021-02-07 DIAGNOSIS — C641 Malignant neoplasm of right kidney, except renal pelvis: Secondary | ICD-10-CM | POA: Diagnosis not present

## 2021-02-07 DIAGNOSIS — Z0000 Encounter for general adult medical examination without abnormal findings: Secondary | ICD-10-CM | POA: Diagnosis not present

## 2021-02-07 DIAGNOSIS — I714 Abdominal aortic aneurysm, without rupture: Secondary | ICD-10-CM | POA: Diagnosis not present

## 2021-02-14 ENCOUNTER — Encounter: Payer: Self-pay | Admitting: Physician Assistant

## 2021-02-14 ENCOUNTER — Ambulatory Visit (HOSPITAL_COMMUNITY)
Admission: RE | Admit: 2021-02-14 | Discharge: 2021-02-14 | Disposition: A | Discharge status: Home / Self Care | Payer: Medicare Other | Source: Ambulatory Visit | Attending: Vascular Surgery | Admitting: Vascular Surgery

## 2021-02-14 ENCOUNTER — Ambulatory Visit: Payer: Medicare Other | Admitting: Physician Assistant

## 2021-02-14 ENCOUNTER — Other Ambulatory Visit: Payer: Self-pay

## 2021-02-14 VITALS — BP 126/86 | HR 56 | Temp 97.6°F | Resp 20 | Ht 71.0 in | Wt 174.7 lb

## 2021-02-14 DIAGNOSIS — N2889 Other specified disorders of kidney and ureter: Secondary | ICD-10-CM | POA: Insufficient documentation

## 2021-02-14 DIAGNOSIS — F419 Anxiety disorder, unspecified: Secondary | ICD-10-CM | POA: Insufficient documentation

## 2021-02-14 DIAGNOSIS — I8393 Asymptomatic varicose veins of bilateral lower extremities: Secondary | ICD-10-CM

## 2021-02-14 DIAGNOSIS — R911 Solitary pulmonary nodule: Secondary | ICD-10-CM | POA: Insufficient documentation

## 2021-02-14 DIAGNOSIS — F529 Unspecified sexual dysfunction not due to a substance or known physiological condition: Secondary | ICD-10-CM | POA: Insufficient documentation

## 2021-02-14 DIAGNOSIS — K5730 Diverticulosis of large intestine without perforation or abscess without bleeding: Secondary | ICD-10-CM | POA: Insufficient documentation

## 2021-02-14 DIAGNOSIS — K921 Melena: Secondary | ICD-10-CM | POA: Insufficient documentation

## 2021-02-14 DIAGNOSIS — F4329 Adjustment disorder with other symptoms: Secondary | ICD-10-CM | POA: Insufficient documentation

## 2021-02-14 DIAGNOSIS — I872 Venous insufficiency (chronic) (peripheral): Secondary | ICD-10-CM | POA: Diagnosis not present

## 2021-02-14 DIAGNOSIS — F32A Depression, unspecified: Secondary | ICD-10-CM | POA: Insufficient documentation

## 2021-02-14 DIAGNOSIS — R69 Illness, unspecified: Secondary | ICD-10-CM | POA: Insufficient documentation

## 2021-02-14 DIAGNOSIS — E663 Overweight: Secondary | ICD-10-CM | POA: Insufficient documentation

## 2021-02-14 DIAGNOSIS — I8390 Asymptomatic varicose veins of unspecified lower extremity: Secondary | ICD-10-CM | POA: Diagnosis not present

## 2021-02-14 DIAGNOSIS — M25519 Pain in unspecified shoulder: Secondary | ICD-10-CM | POA: Insufficient documentation

## 2021-02-14 DIAGNOSIS — K5790 Diverticulosis of intestine, part unspecified, without perforation or abscess without bleeding: Secondary | ICD-10-CM | POA: Insufficient documentation

## 2021-02-14 DIAGNOSIS — Z800 Family history of malignant neoplasm of digestive organs: Secondary | ICD-10-CM | POA: Insufficient documentation

## 2021-02-14 DIAGNOSIS — Z5900 Homelessness unspecified: Secondary | ICD-10-CM | POA: Insufficient documentation

## 2021-02-14 DIAGNOSIS — N529 Male erectile dysfunction, unspecified: Secondary | ICD-10-CM | POA: Insufficient documentation

## 2021-02-14 DIAGNOSIS — I8392 Asymptomatic varicose veins of left lower extremity: Secondary | ICD-10-CM | POA: Diagnosis not present

## 2021-02-14 DIAGNOSIS — E785 Hyperlipidemia, unspecified: Secondary | ICD-10-CM | POA: Insufficient documentation

## 2021-02-14 DIAGNOSIS — C649 Malignant neoplasm of unspecified kidney, except renal pelvis: Secondary | ICD-10-CM | POA: Insufficient documentation

## 2021-02-14 DIAGNOSIS — F17200 Nicotine dependence, unspecified, uncomplicated: Secondary | ICD-10-CM | POA: Insufficient documentation

## 2021-02-14 DIAGNOSIS — F339 Major depressive disorder, recurrent, unspecified: Secondary | ICD-10-CM | POA: Insufficient documentation

## 2021-02-14 DIAGNOSIS — N410 Acute prostatitis: Secondary | ICD-10-CM | POA: Insufficient documentation

## 2021-02-14 DIAGNOSIS — R413 Other amnesia: Secondary | ICD-10-CM | POA: Insufficient documentation

## 2021-02-14 NOTE — Progress Notes (Signed)
Office Note     CC:  follow up Requesting Provider:  Orpah Melter, MD  HPI: Danny Torres Danny Torres is a 75 y.o. (08/26/1945) male who presents for re evaluation of varicose veins in the left lower extremity. He has had varicose veins for several years. He was previously seen her in December of 2021 for similar concerns. He returns for re evaluation because he feels that the veins are even more prominent in the left leg. He denies any excessive swelling.  He is mostly bothered by fatigue feeling and aching pain towards the end of the day. He says this does not occur on ambulation/ exercise but afterwards when he is sitting. He also notices it a lot if he is driving.  He wears 15-20 mmHg compression stockings however by end of day he feels they hurt his feet.  He elevates his legs periodically during the day. He denies any history of DVT, venous ulcerations, trauma, or prior vascular interventions.  He has remote history of tobacco use > 50 years ago. He denies claudication, nonhealing wounds, or rest pain of bilateral lower extremities.   Past Medical History:  Diagnosis Date   AAA (abdominal aortic aneurysm) ([place])    Cancer of kidney ([place])    DVT (deep venous thrombosis) ([place])    Hyperlipidemia     Past Surgical History:  Procedure Laterality Date   LAPAROSCOPIC PARTIAL NEPHRECTOMY Right     Social History   Socioeconomic History   Marital status: Married    Spouse name: Not on file   Number of children: 3   Years of education: Not on file   Highest education level: Not on file  Occupational History   Not on file  Tobacco Use   Smoking status: Former    Packs/day: 0.50    Years: 20.00    Pack years: 10.00    Types: Cigarettes   Smokeless tobacco: Never   Tobacco comments:    quit smoking in 1998  Vaping Use   Vaping Use: Never used  Substance and Sexual Activity   Alcohol use: No   Drug use: No   Sexual activity: Not on file  Other Topics Concern   Not on file   Social History Narrative   Not on file   Social Determinants of Health   Financial Resource Strain: Not on file  Food Insecurity: Not on file  Transportation Needs: Not on file  Physical Activity: Not on file  Stress: Not on file  Social Connections: Not on file  Intimate Partner Violence: Not on file    Family History  Problem Relation Age of Onset   Colon cancer Father    Skin cancer Brother     Current Outpatient Medications  Medication Sig Dispense Refill   Apixaban Starter Pack, '10mg'$  and '5mg'$ , (ELIQUIS DVT/PE STARTER PACK) USE AS DIRECTED 74 each 0   Ascorbic Acid (VITAMIN C) 100 MG tablet Take 100 mg by mouth daily.     aspirin 81 MG tablet Take 81 mg by mouth daily.     buPROPion (WELLBUTRIN XL) 300 MG 24 hr tablet Take 300 mg by mouth daily.     busPIRone (BUSPAR) 15 MG tablet Take 15 mg by mouth 3 (three) times daily.     cyanocobalamin 100 MCG tablet Take 100 mcg by mouth daily.     donepezil (ARICEPT) 10 MG tablet Take 10 mg by mouth daily.     Multiple Vitamin (MULTIVITAMIN) tablet Take 1 tablet by mouth daily.  Omega-3 Fatty Acids (FISH OIL) 1000 MG CAPS Take by mouth.     simvastatin (ZOCOR) 80 MG tablet Take 80 mg by mouth daily.     temazepam (RESTORIL) 30 MG capsule Take 30 mg by mouth at bedtime.     Zinc Sulfate (ZINC 15 PO) Take by mouth.     No current facility-administered medications for this visit.    No Known Allergies   REVIEW OF SYSTEMS:  '[X]'$  denotes positive finding, '[ ]'$  denotes negative finding Cardiac  Comments:  Chest pain or chest pressure:    Shortness of breath upon exertion:    Short of breath when lying flat:    Irregular heart rhythm:        Vascular    Pain in calf, thigh, or hip brought on by ambulation:    Pain in feet at night that wakes you up from your sleep:     Blood clot in your veins:    Leg swelling:         Pulmonary    Oxygen at home:    Productive cough:     Wheezing:         Neurologic    Sudden  weakness in arms or legs:     Sudden numbness in arms or legs:     Sudden onset of difficulty speaking or slurred speech:    Temporary loss of vision in one eye:     Problems with dizziness:         Gastrointestinal    Blood in stool:     Vomited blood:         Genitourinary    Burning when urinating:     Blood in urine:        Psychiatric    Major depression:         Hematologic    Bleeding problems:    Problems with blood clotting too easily:        Skin    Rashes or ulcers:        Constitutional    Fever or chills:      PHYSICAL EXAMINATION:  Vitals:   02/14/21 1254  BP: 126/86  Pulse: (!) 56  Resp: 20  Temp: 97.6 F (36.4 C)  TempSrc: Temporal  SpO2: 98%  Weight: 174 lb 11.2 oz (79.2 kg)  Height: '5\' 11"'$  (1.803 m)    General:  WDWN in NAD; vital signs documented above Gait: Normal HENT: WNL, normocephalic Pulmonary: normal non-labored breathing Cardiac: regular HR Extremities: 2+ femoral, DP and PT pulses bilaterally, feet warm and well perfused. No edema. Some reticular and spider veins BLE, varicose vein on LLE in popliteal fossa that is very tortuous and wraps around to anterior proximal leg Musculoskeletal: no muscle wasting or atrophy  Neurologic: A&O X 3;  No focal weakness or paresthesias are detected Psychiatric:  The pt has Normal affect.   Non-Invasive Vascular Imaging:  02/14/21    +--------------+---------+------+-----------+------------+--------+  LEFT          Reflux NoRefluxReflux TimeDiameter cmsComments                          Yes                                   +--------------+---------+------+-----------+------------+--------+  CFV  yes   >1 second                       +--------------+---------+------+-----------+------------+--------+  FV mid        no                                              +--------------+---------+------+-----------+------------+--------+  Popliteal      no                                              +--------------+---------+------+-----------+------------+--------+  GSV at SFJ              yes    >500 ms      0.49              +--------------+---------+------+-----------+------------+--------+  GSV prox thigh          yes    >500 ms      0.41              +--------------+---------+------+-----------+------------+--------+  GSV mid thigh           yes    >500 ms      0.35              +--------------+---------+------+-----------+------------+--------+  GSV dist thighno                            0.40              +--------------+---------+------+-----------+------------+--------+  GSV at knee                                 0.37              +--------------+---------+------+-----------+------------+--------+  SSV Pop Fossa no                            0.28              +--------------+---------+------+-----------+------------+--------+   Summary:  Left:  - No evidence of deep vein thrombosis from the common femoral through the popliteal veins.  - No evidence of superficial venous thrombosis.  - The common femoral vein is not competent.  - The great saphenous vein is not competent.  - The small saphenous vein is competent.     ASSESSMENT/PLAN:: 75 y.o. male here for follow up for varicose veins LLE> RLE. Previous evaluation showed extensive insufficiency of BLE but his veins at the time were too small to be candidate for ablation. His LLE duplex today shows no DVT or SVT. He does have both deep and superficial reflux in the LLE. His veins are of adequate size to be a candidate for venous ablation. Based on the patient's history and examination, I recommend continuous use of compression stockings, he will now start wearing thigh high, continued elevation, refraining from prolonged sitting or standing. I discussed with the patient the use of her 20-30 mm thigh high compression stockings and  need for 3 month trial of such. The patient will follow up in 3 months with Dr. Scot Dock. Thank you for allowing [place]  to participate in this patient's care.  Karoline Caldwell, PA-C [facility] [phone]  Clinic MD: Donzetta Matters

## 2021-03-14 DIAGNOSIS — Z85528 Personal history of other malignant neoplasm of kidney: Secondary | ICD-10-CM | POA: Diagnosis not present

## 2021-03-15 DIAGNOSIS — C641 Malignant neoplasm of right kidney, except renal pelvis: Secondary | ICD-10-CM | POA: Diagnosis not present

## 2021-03-15 DIAGNOSIS — E782 Mixed hyperlipidemia: Secondary | ICD-10-CM | POA: Diagnosis not present

## 2021-03-15 DIAGNOSIS — G8929 Other chronic pain: Secondary | ICD-10-CM | POA: Diagnosis not present

## 2021-03-19 DIAGNOSIS — R739 Hyperglycemia, unspecified: Secondary | ICD-10-CM | POA: Diagnosis not present

## 2021-05-21 ENCOUNTER — Ambulatory Visit: Payer: Medicare Other | Admitting: Vascular Surgery

## 2021-05-23 DIAGNOSIS — J8410 Pulmonary fibrosis, unspecified: Secondary | ICD-10-CM | POA: Diagnosis not present

## 2021-05-23 DIAGNOSIS — I83813 Varicose veins of bilateral lower extremities with pain: Secondary | ICD-10-CM | POA: Diagnosis not present

## 2021-05-23 DIAGNOSIS — C641 Malignant neoplasm of right kidney, except renal pelvis: Secondary | ICD-10-CM | POA: Diagnosis not present

## 2021-06-25 ENCOUNTER — Other Ambulatory Visit: Payer: Self-pay

## 2021-06-25 ENCOUNTER — Encounter: Payer: Self-pay | Admitting: Vascular Surgery

## 2021-06-25 ENCOUNTER — Ambulatory Visit: Payer: Medicare Other | Admitting: Vascular Surgery

## 2021-06-25 VITALS — BP 123/69 | HR 67 | Temp 97.8°F | Resp 18 | Ht 70.0 in | Wt 176.0 lb

## 2021-06-25 DIAGNOSIS — I8393 Asymptomatic varicose veins of bilateral lower extremities: Secondary | ICD-10-CM

## 2021-06-25 DIAGNOSIS — I872 Venous insufficiency (chronic) (peripheral): Secondary | ICD-10-CM | POA: Diagnosis not present

## 2021-06-25 NOTE — Progress Notes (Signed)
REASON FOR VISIT:   Follow-up of chronic venous insufficiency.  MEDICAL ISSUES:   CHRONIC VENOUS INSUFFICIENCY: This patient has CEAP C2 venous disease.  Currently his symptoms are tolerable.  We have again discussed the importance of intermittent leg elevation and the proper positioning for this.  I have encouraged him to continue to wear his thigh-high compression stockings which do help.  I encouraged him to avoid prolonged sitting and standing.  We discussed importance of exercise specifically walking and water aerobics.  I have encouraged him to avoid prolonged sitting and standing.  Certainly if his symptoms progress I think he could be considered for laser ablation of the left great saphenous vein with 10-20 stabs.  He will call if his symptoms progress.  HPI:   Danny Torres is a pleasant 76 y.o. male who was seen by Karoline Caldwell, PA on 02/14/2021 for varicose veins of the left lower extremity.  He had varicose veins for several years.  His veins were becoming more prominent.  He was having significant symptoms related to his varicose veins.  He denies any previous history of DVT or venous ulcers.  He is had no previous venous interventions.  His venous duplex scan at that time showed some deep venous reflux and superficial venous reflux.  He was prescribed thigh-high compression stockings with a gradient of 20 to 30 mmHg.  He was encouraged to elevate his legs and exercise.  He was set up for a 23-month follow-up visit.  On my history, the patient complains of some aching pain and swelling in the left leg which is aggravated by sitting and relieved with elevation.  He has been wearing his thigh-high compression stockings which do help his symptoms.  He has had no previous history of DVT.  Has had no previous venous procedures.  His symptoms have been fairly stable since he was seen last in August of last year.   Past Medical History:  Diagnosis Date   AAA (abdominal aortic  aneurysm)    Cancer of kidney (HCC)    DVT (deep venous thrombosis) (HCC)    Hyperlipidemia     Family History  Problem Relation Age of Onset   Colon cancer Father    Skin cancer Brother     SOCIAL HISTORY: Social History   Tobacco Use   Smoking status: Former    Packs/day: 0.50    Years: 20.00    Pack years: 10.00    Types: Cigarettes   Smokeless tobacco: Never   Tobacco comments:    quit smoking in 1998  Substance Use Topics   Alcohol use: No    No Known Allergies  Current Outpatient Medications  Medication Sig Dispense Refill   Apixaban Starter Pack, 10mg  and 5mg , (ELIQUIS DVT/PE STARTER PACK) USE AS DIRECTED 74 each 0   Ascorbic Acid (VITAMIN C) 100 MG tablet Take 100 mg by mouth daily.     aspirin 81 MG tablet Take 81 mg by mouth daily.     buPROPion (WELLBUTRIN XL) 300 MG 24 hr tablet Take 300 mg by mouth daily.     busPIRone (BUSPAR) 15 MG tablet Take 15 mg by mouth 3 (three) times daily.     cyanocobalamin 100 MCG tablet Take 100 mcg by mouth daily.     donepezil (ARICEPT) 10 MG tablet Take 10 mg by mouth daily.     Multiple Vitamin (MULTIVITAMIN) tablet Take 1 tablet by mouth daily.     Omega-3 Fatty Acids (FISH  OIL) 1000 MG CAPS Take by mouth.     simvastatin (ZOCOR) 80 MG tablet Take 80 mg by mouth daily.     temazepam (RESTORIL) 30 MG capsule Take 30 mg by mouth at bedtime.     Zinc Sulfate (ZINC 15 PO) Take by mouth.     No current facility-administered medications for this visit.    REVIEW OF SYSTEMS:  [X]  denotes positive finding, [ ]  denotes negative finding Cardiac  Comments:  Chest pain or chest pressure:    Shortness of breath upon exertion:    Short of breath when lying flat:    Irregular heart rhythm:        Vascular    Pain in calf, thigh, or hip brought on by ambulation:    Pain in feet at night that wakes you up from your sleep:     Blood clot in your veins:    Leg swelling:         Pulmonary    Oxygen at home:    Productive  cough:     Wheezing:         Neurologic    Sudden weakness in arms or legs:     Sudden numbness in arms or legs:     Sudden onset of difficulty speaking or slurred speech:    Temporary loss of vision in one eye:     Problems with dizziness:         Gastrointestinal    Blood in stool:     Vomited blood:         Genitourinary    Burning when urinating:     Blood in urine:        Psychiatric    Major depression:         Hematologic    Bleeding problems:    Problems with blood clotting too easily:        Skin    Rashes or ulcers:        Constitutional    Fever or chills:     PHYSICAL EXAM:   Vitals:   06/25/21 1446  BP: 123/69  Pulse: 67  Resp: 18  Temp: 97.8 F (36.6 C)  TempSrc: Temporal  SpO2: 99%  Weight: 176 lb (79.8 kg)  Height: 5\' 10"  (1.778 m)    GENERAL: The patient is a well-nourished male, in no acute distress. The vital signs are documented above. CARDIAC: There is a regular rate and rhythm.  VASCULAR: I do not detect carotid bruits. He has palpable pedal pulses. He has some fairly small varicose veins along the anterior lateral aspect of his left leg.   I did look at his left great saphenous vein myself with the SonoSite.  He the vein is moderately dilated down to the knee.  I did not see any technical challenges if he ever did require endovenous laser ablation.  PULMONARY: There is good air exchange bilaterally without wheezing or rales. ABDOMEN: Soft and non-tender with normal pitched bowel sounds.  MUSCULOSKELETAL: There are no major deformities or cyanosis. NEUROLOGIC: No focal weakness or paresthesias are detected. SKIN: There are no ulcers or rashes noted. PSYCHIATRIC: The patient has a normal affect.  DATA:    VENOUS DUPLEX: I have reviewed the venous duplex scan that was done on 02/14/2021.  This was of the left lower extremity only.  There was no evidence of DVT.  There was deep venous reflux in the common femoral vein.  There was  superficial venous reflux  in the left great saphenous vein from the saphenofemoral junction to the mid thigh.  Diameters of the vein ranged from 3.5 to 4 mm.     Deitra Mayo [facility] [phone]

## 2021-09-15 DIAGNOSIS — R7309 Other abnormal glucose: Secondary | ICD-10-CM | POA: Diagnosis not present

## 2021-09-15 DIAGNOSIS — E782 Mixed hyperlipidemia: Secondary | ICD-10-CM | POA: Diagnosis not present

## 2021-09-15 DIAGNOSIS — E559 Vitamin D deficiency, unspecified: Secondary | ICD-10-CM | POA: Diagnosis not present

## 2021-09-15 DIAGNOSIS — C641 Malignant neoplasm of right kidney, except renal pelvis: Secondary | ICD-10-CM | POA: Diagnosis not present

## 2021-09-22 DIAGNOSIS — E559 Vitamin D deficiency, unspecified: Secondary | ICD-10-CM | POA: Diagnosis not present

## 2021-09-22 DIAGNOSIS — E782 Mixed hyperlipidemia: Secondary | ICD-10-CM | POA: Diagnosis not present

## 2021-09-22 DIAGNOSIS — Z8639 Personal history of other endocrine, nutritional and metabolic disease: Secondary | ICD-10-CM | POA: Diagnosis not present

## 2021-09-22 DIAGNOSIS — I714 Abdominal aortic aneurysm, without rupture, unspecified: Secondary | ICD-10-CM | POA: Diagnosis not present

## 2021-09-22 DIAGNOSIS — C641 Malignant neoplasm of right kidney, except renal pelvis: Secondary | ICD-10-CM | POA: Diagnosis not present

## 2021-09-22 DIAGNOSIS — I83813 Varicose veins of bilateral lower extremities with pain: Secondary | ICD-10-CM | POA: Diagnosis not present

## 2021-12-05 DIAGNOSIS — H2513 Age-related nuclear cataract, bilateral: Secondary | ICD-10-CM | POA: Diagnosis not present

## 2021-12-05 DIAGNOSIS — H5203 Hypermetropia, bilateral: Secondary | ICD-10-CM | POA: Diagnosis not present

## 2021-12-09 DIAGNOSIS — R220 Localized swelling, mass and lump, head: Secondary | ICD-10-CM | POA: Diagnosis not present

## 2021-12-09 DIAGNOSIS — K1120 Sialoadenitis, unspecified: Secondary | ICD-10-CM | POA: Diagnosis not present

## 2021-12-09 DIAGNOSIS — G4700 Insomnia, unspecified: Secondary | ICD-10-CM | POA: Diagnosis not present

## 2021-12-10 ENCOUNTER — Ambulatory Visit: Payer: Medicare Other | Admitting: Pulmonary Disease

## 2021-12-10 ENCOUNTER — Encounter: Payer: Self-pay | Admitting: Pulmonary Disease

## 2021-12-10 VITALS — BP 122/68 | HR 64 | Temp 97.8°F | Ht 70.0 in | Wt 179.6 lb

## 2021-12-10 DIAGNOSIS — R918 Other nonspecific abnormal finding of lung field: Secondary | ICD-10-CM | POA: Diagnosis not present

## 2021-12-10 NOTE — Patient Instructions (Signed)
I am glad you are doing well with regard to your breathing Okay to proceed with dental surgery under general anesthesia.  We will send the note to your primary care Follow-up in 1
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
## 2021-12-15 ENCOUNTER — Encounter: Payer: Self-pay | Admitting: Cardiology

## 2021-12-15 ENCOUNTER — Ambulatory Visit: Payer: Medicare Other | Admitting: Cardiology

## 2021-12-15 VITALS — BP 132/66 | HR 68 | Temp 98.6°F | Resp 16 | Ht 70.0 in | Wt 179.2 lb

## 2021-12-15 DIAGNOSIS — I340 Nonrheumatic mitral (valve) insufficiency: Secondary | ICD-10-CM | POA: Diagnosis not present

## 2021-12-15 DIAGNOSIS — E7800 Pure hypercholesterolemia, unspecified: Secondary | ICD-10-CM | POA: Diagnosis not present

## 2021-12-15 DIAGNOSIS — Z87891 Personal history of nicotine dependence: Secondary | ICD-10-CM

## 2021-12-15 DIAGNOSIS — I7143 Infrarenal abdominal aortic aneurysm, without rupture: Secondary | ICD-10-CM

## 2021-12-15 DIAGNOSIS — Z01810 Encounter for preprocedural cardiovascular examination: Secondary | ICD-10-CM

## 2021-12-16 ENCOUNTER — Other Ambulatory Visit (HOSPITAL_COMMUNITY): Payer: Self-pay | Admitting: Cardiology

## 2021-12-16 ENCOUNTER — Other Ambulatory Visit: Payer: Self-pay | Admitting: Cardiology

## 2021-12-17 ENCOUNTER — Ambulatory Visit (HOSPITAL_COMMUNITY)
Admission: RE | Admit: 2021-12-17 | Discharge: 2021-12-17 | Disposition: A | Discharge status: Home / Self Care | Payer: Medicare Other | Source: Ambulatory Visit | Attending: Cardiology | Admitting: Cardiology

## 2021-12-17 DIAGNOSIS — Z01810 Encounter for preprocedural cardiovascular examination: Secondary | ICD-10-CM

## 2021-12-18 LAB — ECHOCARDIOGRAM COMPLETE
Area-P 1/2: 2.42 cm2
Calc EF: 52.8 %
S' Lateral: 3.4 cm
Single Plane A2C EF: 50.5 %
Single Plane A4C EF: 52.1 %

## 2021-12-19 ENCOUNTER — Telehealth: Payer: Self-pay | Admitting: Cardiology

## 2021-12-24 ENCOUNTER — Encounter: Payer: Self-pay | Admitting: Vascular Surgery

## 2021-12-24 ENCOUNTER — Encounter: Payer: Self-pay | Admitting: Cardiology

## 2021-12-24 ENCOUNTER — Ambulatory Visit: Payer: Medicare Other | Admitting: Vascular Surgery

## 2021-12-24 VITALS — BP 113/62 | HR 60 | Temp 98.1°F | Resp 16 | Ht 70.0 in | Wt 176.0 lb

## 2021-12-24 DIAGNOSIS — I872 Venous insufficiency (chronic) (peripheral): Secondary | ICD-10-CM | POA: Diagnosis not present

## 2021-12-24 DIAGNOSIS — I83812 Varicose veins of left lower extremities with pain: Secondary | ICD-10-CM

## 2021-12-24 NOTE — Progress Notes (Signed)
REASON FOR VISIT:   Follow-up of chronic venous insufficiency.  MEDICAL ISSUES:   CHRONIC VENOUS INSUFFICIENCY: This patient has painful varicose veins of the left lower extremity.  His symptoms are relatively stable.  He has been swimming more which has helped his symptoms.  I have encouraged him to continue to elevate his legs daily.  I have encouraged him to avoid prolonged sitting and standing.  Fortunately he maintains a healthy weight.  Given that he still having symptoms he would like to consider laser ablation and stab phlebectomies on the left which I think he would be a reasonable candidate for.  He would require greater than 20 stab phlebectomies.  He has reflux down to the mid to distal thigh and the vein is about 4 mm throughout.  We would likely cannulate the vein in the distal thigh.  We have discussed the indications for the procedure, that is to lower the venous pressure and also potential complications including 1% risk of DVT.  He will consider this and call Norberto Sorenson, RN if he elects to proceed with laser ablation of the left great saphenous vein.   HPI:   Danny Torres is a pleasant 76 y.o. male who I saw on 06/25/2021 with chronic venous insufficiency.  He had CEAP C2 venous disease.  We discussed importance of leg elevation and the proper positioning for this.  I encouraged him to wear a thigh-high compression stocking.  I encouraged him to avoid prolonged sitting and standing.  We discussed the importance of exercise.  I felt that if his symptoms progressed he would be a candidate for laser ablation of the left great saphenous vein with 10-20 stabs.  Of note, his venous duplex scan on 02/14/2021 showed no evidence of DVT in the left lower extremity.  He had deep venous reflux in the common femoral vein.  He had superficial venous reflux in the left great saphenous vein down to the mid thigh.  Diameters of the vein ranged from 3.5-4 mm.  Since I saw him last, he  continues to have some aching pain and heaviness in the left leg which is aggravated by sitting and standing and relieved with elevation.  It is also relieved with ambulating.  He is also been swimming a lot lately which also seems to help his symptoms.  He denies any previous history of DVT or previous venous procedures.  Past Medical History:  Diagnosis Date   AAA (abdominal aortic aneurysm) ([place])    Cancer of kidney ([place])    DVT (deep venous thrombosis) (HCC)    Hyperlipidemia     Family History  Problem Relation Age of Onset   Colon cancer Father    Skin cancer Brother     SOCIAL HISTORY: Social History   Tobacco Use   Smoking status: Former    Packs/day: 0.50    Years: 20.00    Total pack years: 10.00    Types: Cigarettes    Quit date: 1998    Years since quitting: 25.5   Smokeless tobacco: Never   Tobacco comments:    quit smoking in 1998  Substance Use Topics   Alcohol use: No    No Known Allergies  Current Outpatient Medications  Medication Sig Dispense Refill   Ascorbic Acid (VITAMIN C) 100 MG tablet Take 100 mg by mouth daily.     aspirin 81 MG tablet Take 81 mg by mouth daily.     buPROPion (WELLBUTRIN XL) 300 MG  24 hr tablet Take 300 mg by mouth daily.     busPIRone (BUSPAR) 15 MG tablet Take 15 mg by mouth 3 (three) times daily.     cyanocobalamin 100 MCG tablet Take 100 mcg by mouth daily.     donepezil (ARICEPT) 10 MG tablet Take 10 mg by mouth daily.     Multiple Vitamin (MULTIVITAMIN) tablet Take 1 tablet by mouth daily.     Omega-3 Fatty Acids (FISH OIL) 1000 MG CAPS Take by mouth.     simvastatin (ZOCOR) 80 MG tablet Take 80 mg by mouth daily.     temazepam (RESTORIL) 30 MG capsule Take 30 mg by mouth at bedtime.     Zinc Sulfate (ZINC 15 PO) Take by mouth.     No current facility-administered medications for this visit.    REVIEW OF SYSTEMS:  '[X]'$  denotes positive finding, '[ ]'$  denotes negative finding Cardiac  Comments:  Chest pain or chest  pressure:    Shortness of breath upon exertion:    Short of breath when lying flat:    Irregular heart rhythm:        Vascular    Pain in calf, thigh, or hip brought on by ambulation:    Pain in feet at night that wakes you up from your sleep:     Blood clot in your veins:    Leg swelling:         Pulmonary    Oxygen at home:    Productive cough:     Wheezing:         Neurologic    Sudden weakness in arms or legs:     Sudden numbness in arms or legs:     Sudden onset of difficulty speaking or slurred speech:    Temporary loss of vision in one eye:     Problems with dizziness:         Gastrointestinal    Blood in stool:     Vomited blood:         Genitourinary    Burning when urinating:     Blood in urine:        Psychiatric    Major depression:         Hematologic    Bleeding problems:    Problems with blood clotting too easily:        Skin    Rashes or ulcers:        Constitutional    Fever or chills:     PHYSICAL EXAM:   Vitals:   12/24/21 1519  BP: 113/62  Pulse: 60  Resp: 16  Temp: 98.1 F (36.7 C)  TempSrc: Temporal  SpO2: 97%  Weight: 176 lb (79.8 kg)  Height: '5\' 10"'$  (1.778 m)    GENERAL: The patient is a well-nourished male, in no acute distress. The vital signs are documented above. CARDIAC: There is a regular rate and rhythm.  VASCULAR: I do not detect carotid bruits. He has palpable pedal pulses. He has some dilated varicose veins in his left leg especially posteriorly.     I did look at his left great saphenous vein myself with the SonoSite.  He has reflux down to the distal thigh.  The vein is about 4 mm throughout by my measurements. PULMONARY: There is good air exchange bilaterally without wheezing or rales. ABDOMEN: Soft and non-tender with normal pitched bowel sounds.  MUSCULOSKELETAL: There are no major deformities or cyanosis. NEUROLOGIC: No focal weakness or paresthesias are detected.  SKIN: There are no ulcers or rashes  noted. PSYCHIATRIC: The patient has a normal affect.  DATA:    VENOUS DUPLEX: I reviewed his previous venous duplex scan of the left lower extremity which showed reflux in the left great saphenous vein down to the mid thigh.  The vein was about 4 mm in diameter at that time.  There was also deep venous reflux in the common femoral vein.  A total of 30 minutes was spent on this visit. 15 minutes was face to face time. More than 50% of the time was spent on counseling and coordinating with the patient.    Deitra Mayo [facility] [phone]

## 2021-12-25 DIAGNOSIS — Z01818 Encounter for other preprocedural examination: Secondary | ICD-10-CM | POA: Diagnosis not present

## 2022-02-05 DIAGNOSIS — G4700 Insomnia, unspecified: Secondary | ICD-10-CM | POA: Diagnosis not present

## 2022-02-09 ENCOUNTER — Other Ambulatory Visit: Payer: Self-pay | Admitting: Family Medicine

## 2022-02-09 DIAGNOSIS — Z131 Encounter for screening for diabetes mellitus: Secondary | ICD-10-CM | POA: Diagnosis not present

## 2022-02-09 DIAGNOSIS — C649 Malignant neoplasm of unspecified kidney, except renal pelvis: Secondary | ICD-10-CM

## 2022-02-09 DIAGNOSIS — J8410 Pulmonary fibrosis, unspecified: Secondary | ICD-10-CM | POA: Diagnosis not present

## 2022-02-09 DIAGNOSIS — Z23 Encounter for immunization: Secondary | ICD-10-CM | POA: Diagnosis not present

## 2022-02-09 DIAGNOSIS — E782 Mixed hyperlipidemia: Secondary | ICD-10-CM | POA: Diagnosis not present

## 2022-02-09 DIAGNOSIS — I714 Abdominal aortic aneurysm, without rupture, unspecified: Secondary | ICD-10-CM | POA: Diagnosis not present

## 2022-02-09 DIAGNOSIS — Z1159 Encounter for screening for other viral diseases: Secondary | ICD-10-CM | POA: Diagnosis not present

## 2022-02-09 DIAGNOSIS — Z1211 Encounter for screening for malignant neoplasm of colon: Secondary | ICD-10-CM | POA: Diagnosis not present

## 2022-02-09 DIAGNOSIS — E559 Vitamin D deficiency, unspecified: Secondary | ICD-10-CM | POA: Diagnosis not present

## 2022-02-09 DIAGNOSIS — Z0000 Encounter for general adult medical examination without abnormal findings: Secondary | ICD-10-CM | POA: Diagnosis not present

## 2022-02-09 DIAGNOSIS — C641 Malignant neoplasm of right kidney, except renal pelvis: Secondary | ICD-10-CM | POA: Diagnosis not present

## 2022-03-05 ENCOUNTER — Other Ambulatory Visit: Payer: Medicare Other

## 2022-03-05 DIAGNOSIS — G4700 Insomnia, unspecified: Secondary | ICD-10-CM | POA: Diagnosis not present

## 2022-03-06 DIAGNOSIS — M79671 Pain in right foot: Secondary | ICD-10-CM | POA: Diagnosis not present

## 2022-03-06 DIAGNOSIS — M7661 Achilles tendinitis, right leg: Secondary | ICD-10-CM | POA: Diagnosis not present

## 2022-03-06 DIAGNOSIS — M7731 Calcaneal spur, right foot: Secondary | ICD-10-CM | POA: Diagnosis not present

## 2022-03-06 DIAGNOSIS — M795 Residual foreign body in soft tissue: Secondary | ICD-10-CM | POA: Diagnosis not present

## 2022-03-24 ENCOUNTER — Inpatient Hospital Stay: Admission: RE | Admit: 2022-03-24 | Payer: Medicare Other | Source: Ambulatory Visit

## 2022-04-22 DIAGNOSIS — G4700 Insomnia, unspecified: Secondary | ICD-10-CM | POA: Diagnosis not present

## 2022-05-07 ENCOUNTER — Ambulatory Visit
Admission: RE | Admit: 2022-05-07 | Discharge: 2022-05-07 | Disposition: A | Discharge status: Home / Self Care | Payer: Medicare Other | Source: Ambulatory Visit | Attending: Family Medicine | Admitting: Family Medicine

## 2022-05-07 DIAGNOSIS — K5730 Diverticulosis of large intestine without perforation or abscess without bleeding: Secondary | ICD-10-CM | POA: Diagnosis not present

## 2022-05-07 DIAGNOSIS — C649 Malignant neoplasm of unspecified kidney, except renal pelvis: Secondary | ICD-10-CM

## 2022-05-07 DIAGNOSIS — I700 Atherosclerosis of aorta: Secondary | ICD-10-CM | POA: Diagnosis not present

## 2022-05-07 DIAGNOSIS — Z85528 Personal history of other malignant neoplasm of kidney: Secondary | ICD-10-CM | POA: Diagnosis not present

## 2022-05-07 DIAGNOSIS — I714 Abdominal aortic aneurysm, without rupture, unspecified: Secondary | ICD-10-CM | POA: Diagnosis not present

## 2022-05-07 MED ORDER — IOPAMIDOL (ISOVUE-300) INJECTION 61%
100.0000 mL | Freq: Once | INTRAVENOUS | Status: AC | PRN
  Administered 2022-05-07: 100 mL via INTRAVENOUS

## 2022-05-25 DIAGNOSIS — G4700 Insomnia, unspecified: Secondary | ICD-10-CM | POA: Diagnosis not present

## 2022-05-25 DIAGNOSIS — I714 Abdominal aortic aneurysm, without rupture, unspecified: Secondary | ICD-10-CM | POA: Diagnosis not present

## 2022-05-25 DIAGNOSIS — E871 Hypo-osmolality and hyponatremia: Secondary | ICD-10-CM | POA: Diagnosis not present

## 2022-05-25 DIAGNOSIS — K5730 Diverticulosis of large intestine without perforation or abscess without bleeding: Secondary | ICD-10-CM | POA: Diagnosis not present

## 2022-06-29 DIAGNOSIS — D49511 Neoplasm of unspecified behavior of right kidney: Secondary | ICD-10-CM | POA: Diagnosis not present

## 2022-06-29 DIAGNOSIS — Z85528 Personal history of other malignant neoplasm of kidney: Secondary | ICD-10-CM | POA: Diagnosis not present

## 2022-07-17 DIAGNOSIS — H04123 Dry eye syndrome of bilateral lacrimal glands: Secondary | ICD-10-CM | POA: Diagnosis not present

## 2022-07-17 DIAGNOSIS — H2513 Age-related nuclear cataract, bilateral: Secondary | ICD-10-CM | POA: Diagnosis not present

## 2022-07-17 DIAGNOSIS — H524 Presbyopia: Secondary | ICD-10-CM | POA: Diagnosis not present

## 2022-08-05 DIAGNOSIS — G4700 Insomnia, unspecified: Secondary | ICD-10-CM | POA: Diagnosis not present

## 2022-08-28 DIAGNOSIS — Z85528 Personal history of other malignant neoplasm of kidney: Secondary | ICD-10-CM | POA: Diagnosis not present

## 2022-08-28 DIAGNOSIS — E559 Vitamin D deficiency, unspecified: Secondary | ICD-10-CM | POA: Diagnosis not present

## 2022-08-28 DIAGNOSIS — I714 Abdominal aortic aneurysm, without rupture, unspecified: Secondary | ICD-10-CM | POA: Diagnosis not present

## 2022-08-28 DIAGNOSIS — E782 Mixed hyperlipidemia: Secondary | ICD-10-CM | POA: Diagnosis not present

## 2022-08-28 DIAGNOSIS — Z1211 Encounter for screening for malignant neoplasm of colon: Secondary | ICD-10-CM | POA: Diagnosis not present

## 2022-10-27 ENCOUNTER — Other Ambulatory Visit: Payer: Medicare Other

## 2022-11-17 ENCOUNTER — Other Ambulatory Visit: Payer: Medicare Other

## 2022-11-18 ENCOUNTER — Ambulatory Visit: Payer: Medicare Other

## 2022-11-18 DIAGNOSIS — I7143 Infrarenal abdominal aortic aneurysm, without rupture: Secondary | ICD-10-CM

## 2022-11-22 ENCOUNTER — Other Ambulatory Visit: Payer: Self-pay | Admitting: Cardiology

## 2022-11-22 DIAGNOSIS — I7143 Infrarenal abdominal aortic aneurysm, without rupture: Secondary | ICD-10-CM

## 2022-11-24 NOTE — Progress Notes (Signed)
Called patient no answer left a vm

## 2022-12-31 ENCOUNTER — Other Ambulatory Visit: Payer: Self-pay | Admitting: *Deleted

## 2022-12-31 DIAGNOSIS — I83812 Varicose veins of left lower extremities with pain: Secondary | ICD-10-CM

## 2023-01-13 ENCOUNTER — Ambulatory Visit: Payer: Medicare Other | Admitting: Vascular Surgery

## 2023-01-13 ENCOUNTER — Encounter: Payer: Self-pay | Admitting: Vascular Surgery

## 2023-01-13 ENCOUNTER — Ambulatory Visit (HOSPITAL_COMMUNITY)
Admission: RE | Admit: 2023-01-13 | Discharge: 2023-01-13 | Disposition: A | Discharge status: Home / Self Care | Payer: Medicare Other | Source: Ambulatory Visit | Attending: Vascular Surgery | Admitting: Vascular Surgery

## 2023-01-13 VITALS — BP 104/57 | HR 63 | Temp 97.4°F | Resp 18 | Ht 70.0 in | Wt 170.0 lb

## 2023-01-13 DIAGNOSIS — I83812 Varicose veins of left lower extremities with pain: Secondary | ICD-10-CM

## 2023-01-13 DIAGNOSIS — I872 Venous insufficiency (chronic) (peripheral): Secondary | ICD-10-CM | POA: Diagnosis not present

## 2023-01-13 DIAGNOSIS — I8393 Asymptomatic varicose veins of bilateral lower extremities: Secondary | ICD-10-CM

## 2023-01-13 NOTE — Progress Notes (Signed)
REASON FOR VISIT:   Follow-up of chronic venous insufficiency  MEDICAL ISSUES:   CHRONIC VENOUS INSUFFICIENCY: This patient has both superficial venous reflux and deep venous reflux.  He has reflux in the left great saphenous vein down to the proximal calf and also reflux in the small saphenous vein.  He has been wearing his thigh-high compression stockings which helps some but he still having symptoms.  He also elevates his legs.  Given that he is having persistent symptoms despite conservative measures, I think he would be a good candidate for laser ablation of the left great saphenous vein down to the distal thigh and greater than 20 stab phlebectomies.  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  I think technically this would be very challenging to address.  Thus I would hold off on addressing this initially.  I have discussed the indications for endovenous laser ablation of the left GSV, that is to lower the pressure in the veins and potentially help relieve the symptoms from venous hypertension.  I have also discussed alternative options such as conservative treatment as described above. I have discussed the potential complications of the procedure, including bleeding, bruising, leg swelling, deep venous thrombosis (<1% risk), or failure of the vein to close <1% risk).  I have also explained that venous insufficiency is a chronic disease, and that the patient is at risk for recurrent varicose veins in the future.  All of the patient's questions were encouraged and answered. They are agreeable to proceed.   I have discussed with the patient the indications for stab phlebectomy.  I have explained to the patient that that will have small scars from the stab incisions.  I explained that the other risks include bruising, bleeding, and phlebitis.   HPI:   Danny Torres is a pleasant 77 y.o. male who I last saw on 12/24/2021 with chronic venous insufficiency.  He had painful varicose veins of the left lower extremity.  We discussed conservative measures.  He was still having significant symptoms and I felt that he might be a candidate for laser ablation of the left great saphenous vein with stab phlebectomies.  He had reflux down to the distal thigh and the vein was dilated throughout.  I felt that I would cannulate the vein in the distal thigh.  Ultimately his symptoms improved and he elected not to proceed with laser ablation.  Since I saw him last his symptoms have gotten worse.  He describes significant aching pain and heaviness in his left leg which is aggravated by sitting and standing and relieved with elevation.  He has been wearing his thigh-high compression stockings with a gradient of 20 to 30 mmHg.  His symptoms are worse at the end of the day.  Symptoms are less severe on the right side.  He does have a small abdominal aortic aneurysm (3 cm) which is followed by Dr. Elesa Massed.  He is scheduled for follow-up in the near future.  Past Medical History:  Diagnosis Date   AAA (abdominal aortic aneurysm) (HCC)    Cancer of kidney (HCC)    DVT (deep venous thrombosis) (HCC)    Hyperlipidemia     Family History  Problem Relation Age of Onset   Colon cancer Father    Skin cancer Brother  SOCIAL HISTORY: Social History   Tobacco Use   Smoking status: Former    Current packs/day: 0.00    Average packs/day: 0.5 packs/day for 20.0 years (10.0 ttl pk-yrs)    Types: Cigarettes    Start date: 80    Quit date: 1998    Years since quitting: 26.5   Smokeless tobacco: Never   Tobacco comments:    quit smoking in 1998  Substance Use Topics   Alcohol use: No    No Known Allergies  Current Outpatient Medications  Medication Sig Dispense Refill   Ascorbic Acid (VITAMIN C) 100 MG tablet Take 100 mg by mouth daily.      aspirin 81 MG tablet Take 81 mg by mouth daily.     buPROPion (WELLBUTRIN XL) 300 MG 24 hr tablet Take 300 mg by mouth daily.     busPIRone (BUSPAR) 15 MG tablet Take 15 mg by mouth 3 (three) times daily.     cyanocobalamin 100 MCG tablet Take 100 mcg by mouth daily.     donepezil (ARICEPT) 10 MG tablet Take 10 mg by mouth daily.     Multiple Vitamin (MULTIVITAMIN) tablet Take 1 tablet by mouth daily.     Omega-3 Fatty Acids (FISH OIL) 1000 MG CAPS Take by mouth.     simvastatin (ZOCOR) 80 MG tablet Take 80 mg by mouth daily.     temazepam (RESTORIL) 30 MG capsule Take 30 mg by mouth at bedtime.     Zinc Sulfate (ZINC 15 PO) Take by mouth.     No current facility-administered medications for this visit.    REVIEW OF SYSTEMS:  [X]  denotes positive finding, [ ]  denotes negative finding Cardiac  Comments:  Chest pain or chest pressure:    Shortness of breath upon exertion:    Short of breath when lying flat:    Irregular heart rhythm:        Vascular    Pain in calf, thigh, or hip brought on by ambulation:    Pain in feet at night that wakes you up from your sleep:     Blood clot in your veins:    Leg swelling:         Pulmonary    Oxygen at home:    Productive cough:     Wheezing:         Neurologic    Sudden weakness in arms or legs:     Sudden numbness in arms or legs:     Sudden onset of difficulty speaking or slurred speech:    Temporary loss of vision in one eye:     Problems with dizziness:         Gastrointestinal    Blood in stool:     Vomited blood:         Genitourinary    Burning when urinating:     Blood in urine:        Psychiatric    Major depression:         Hematologic    Bleeding problems:    Problems with blood clotting too easily:        Skin    Rashes or ulcers:        Constitutional    Fever or chills:     PHYSICAL EXAM:   Vitals:   01/13/23 1008  BP: (!) 104/57  Pulse: 63  Resp: 18  Temp: (!) 97.4 F (36.3 C)  TempSrc: Temporal   SpO2: 97%  Weight:  170 lb (77.1 kg)  Height: 5\' 10"  (1.778 m)    GENERAL: The patient is a well-nourished male, in no acute distress. The vital signs are documented above. CARDIAC: There is a regular rate and rhythm.  VASCULAR: He has palpable pedal pulses. He has multiple large varicose veins in his left leg which are under significant pressure.     I did look at his left great saphenous vein myself with the SonoSite and he has reflux down to the distal thigh.  The vein is dilated throughout.  I would likely cannulate the vein in the distal thigh.  I looked at the small saphenous vein also.  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  This looks technically challenging. PULMONARY: There is good air exchange bilaterally without wheezing or rales. ABDOMEN: Soft and non-tender with normal pitched bowel sounds.  MUSCULOSKELETAL: There are no major deformities or cyanosis. NEUROLOGIC: No focal weakness or paresthesias are detected. SKIN: There are no ulcers or rashes noted. PSYCHIATRIC: The patient has a normal affect.  DATA:    VENOUS DUPLEX: I have independently interpreted his venous duplex scan of the left lower extremity today.  There was no evidence of DVT.  There was deep venous reflux in the common femoral vein and femoral vein.  There was superficial venous reflux from the saphenofemoral junction to the proximal calf.  Diameters of the vein ranged from 4-6 mm.  There was also reflux in the small saphenous vein which was dilated up to 8.4 mm in the proximal calf.  The results of the tests are summarized in the diagram below.    Waverly Ferrari [facility] [phone]

## 2023-01-15 ENCOUNTER — Encounter: Payer: Self-pay | Admitting: Cardiology

## 2023-01-15 ENCOUNTER — Ambulatory Visit: Payer: Medicare Other | Admitting: Cardiology

## 2023-01-15 VITALS — BP 125/70 | HR 66 | Resp 16 | Ht 70.0 in | Wt 170.2 lb

## 2023-01-15 DIAGNOSIS — I7143 Infrarenal abdominal aortic aneurysm, without rupture: Secondary | ICD-10-CM | POA: Diagnosis not present

## 2023-01-15 DIAGNOSIS — Z87891 Personal history of nicotine dependence: Secondary | ICD-10-CM

## 2023-01-15 DIAGNOSIS — I340 Nonrheumatic mitral (valve) insufficiency: Secondary | ICD-10-CM

## 2023-01-15 DIAGNOSIS — E7800 Pure hypercholesterolemia, unspecified: Secondary | ICD-10-CM | POA: Diagnosis not present

## 2023-01-15 NOTE — Progress Notes (Signed)
Date:  01/15/2023   ID:  Landin Engquist, DOB Aug 18, 1945, MRN [id number]  PCP:  Irven Coe, MD  Cardiologist:  Tessa Lerner, DO, [facility] (established care 03/21/2020)  Date: 01/15/23 Last Office Visit: 12/15/2021  Chief Complaint  Patient presents with   Follow up Abdominal aneurysm status post imaging   Follow-up    HPI  Danny Torres is a 77 y.o. male whose past medical history and cardiovascular risk factors include: Hx of renal cancer s/p partial nephrectomy, aortic atherosclerosis, infrarenal AAA (seen on CT abdomen and pelvis 10/13/2019), hyperlipidemia, former smoker, advanced age.  Patient presents today for 1 year follow-up visit with regards to his abdominal aortic aneurysm.  He had a abdominal duplex and May 2024 which notes the abdominal aortic aneurysm measuring 3.1 x 3.1 x 3.2 cm.  Since last office visit, he is doing well from a cardiovascular standpoint.  He denies anginal chest pain or heart failure symptoms.  Patient states that his dental dental implant surgery went well without any cardiovascular complications.  He currently is seeing vascular and vein under the care of Dr. Edilia Bo and plans to have venous ablation in the near future  ALLERGIES: No Known Allergies  MEDICATION LIST PRIOR TO VISIT: Current Meds  Medication Sig   Ascorbic Acid (VITAMIN C) 100 MG tablet Take 100 mg by mouth daily.   aspirin 81 MG tablet Take 81 mg by mouth daily.   buPROPion (WELLBUTRIN XL) 300 MG 24 hr tablet Take 300 mg by mouth daily.   busPIRone (BUSPAR) 15 MG tablet Take 15 mg by mouth 3 (three) times daily.   cyanocobalamin 100 MCG tablet Take 100 mcg by mouth daily.   donepezil (ARICEPT) 10 MG tablet Take 10 mg by mouth daily.   Multiple Vitamin (MULTIVITAMIN) tablet Take 1 tablet by mouth daily.   Omega-3 Fatty Acids (FISH OIL) 1000 MG CAPS Take by mouth.   simvastatin (ZOCOR) 80 MG tablet Take 80 mg by mouth daily.   temazepam (RESTORIL) 30 MG capsule  Take 30 mg by mouth at bedtime.   Zinc Sulfate (ZINC 15 PO) Take by mouth.   [DISCONTINUED] temazepam (RESTORIL) 30 MG capsule Take 30 mg by mouth once.     PAST MEDICAL HISTORY: Past Medical History:  Diagnosis Date   AAA (abdominal aortic aneurysm) (HCC)    Cancer of kidney (HCC)    DVT (deep venous thrombosis) (HCC)    Hyperlipidemia     PAST SURGICAL HISTORY: Past Surgical History:  Procedure Laterality Date   LAPAROSCOPIC PARTIAL NEPHRECTOMY Right     FAMILY HISTORY: The patient family history includes Colon cancer in his father; Skin cancer in his brother.  SOCIAL HISTORY:  The patient  reports that he quit smoking about 26 years ago. His smoking use included cigarettes. He started smoking about 46 years ago. He has a 10 pack-year smoking history. He has never used smokeless tobacco. He reports that he does not drink alcohol and does not use drugs.  REVIEW OF SYSTEMS: Review of Systems  Cardiovascular:  Negative for chest pain, claudication, dyspnea on exertion, irregular heartbeat, leg swelling, near-syncope, orthopnea, palpitations, paroxysmal nocturnal dyspnea and syncope.  Respiratory:  Negative for shortness of breath.   Hematologic/Lymphatic: Negative for bleeding problem.  Musculoskeletal:  Negative for muscle cramps and myalgias.  Neurological:  Negative for dizziness and light-headedness.   PHYSICAL EXAM:    01/15/2023    2:38 PM 01/13/2023   10:08 AM 12/24/2021    3:19  PM  Vitals with BMI  Height 5\' 10"  5\' 10"  5\' 10"   Weight 170 lbs 3 oz 170 lbs 176 lbs  BMI 24.42 24.39 25.25  Systolic 125 104 644  Diastolic 70 57 62  Pulse 66 63 60   Physical Exam  Constitutional: No distress.  Age appropriate, hemodynamically stable.   Neck: No JVD present.  Cardiovascular: Normal rate, regular rhythm, S1 normal, S2 normal and intact distal pulses. Exam reveals no gallop, no S3 and no S4.  No murmur heard. Pulses:      Popliteal pulses are 1+ on the right side and  1+ on the left side.       Dorsalis pedis pulses are 2+ on the right side and 2+ on the left side.       Posterior tibial pulses are 2+ on the right side and 2+ on the left side.  Varicose veins noted in the left lower extremity.  Pulmonary/Chest: Effort normal and breath sounds normal. No stridor. He has no wheezes. He has no rales.  Abdominal: Soft. Bowel sounds are normal. He exhibits no distension. There is no abdominal tenderness.  Musculoskeletal:        General: No edema.     Cervical back: Neck supple.  Neurological: He is alert and oriented to person, place, and time. He has intact cranial nerves (2-12).  Skin: Skin is warm and moist.    CARDIAC DATABASE: EKG: January 15, 2023: Sinus rhythm, 63 bpm, left axis, left anterior fascicular block, LVH per voltage criteria, nonspecific T wave abnormality, without underlying injury pattern.  Echocardiogram: December 17, 2021:  1. Left ventricular ejection fraction, by estimation, is 60 to 65%. The  left ventricle has normal function. The left ventricle has no regional  wall motion abnormalities. Left ventricular diastolic parameters were  normal. The average left ventricular  global longitudinal strain is -21.5 %. The global longitudinal strain is  normal.   2. Right ventricular systolic function is normal. The right ventricular  size is normal.   3. The mitral valve is normal in structure. No evidence of mitral valve  regurgitation. No evidence of mitral stenosis.   4. The aortic valve is tricuspid. Aortic valve regurgitation is not  visualized. No aortic stenosis is present.   5. The inferior vena cava is normal in size with greater than 50%  respiratory variability, suggesting right atrial pressure of 3 mmHg.    Stress Testing: No results found for this or any previous visit from the past 1095 days.  Heart Catheterization: None  CT Abdomen pelvis with contrast 10/13/2019: Stable 3 cm infrarenal abdominal aortic aneurysm.  Recommend followup by ultrasound in 3 years.  Abdominal Aortic Duplex 11/18/2022:  Moderate dilatation of the abdominal aorta is noted in the mid aorta. An abdominal aortic aneurysm measuring 3.1 x 3.1 x 3.2 cm is seen.  Normal iliac artery and aortic velocity. Mild atherosclerotic changes noted in the aorta. Recheck in 1 year.   LABORATORY DATA: External Labs: Collected: 09/15/2019 Creatinine 0.91 mg/dL. eGFR: 82 mL/min per 1.73 m Potassium 4.3 Lipid profile: Total cholesterol 125, triglycerides 80, HDL 40, LDL 88  IMPRESSION:    ICD-10-CM   1. Infrarenal abdominal aortic aneurysm (AAA) without rupture (HCC)  I71.43 EKG 12-Lead    AAA Duplex    2. Hypercholesterolemia  E78.00     3. Nonrheumatic mitral valve regurgitation  I34.0     4. Former smoker  Z87.891        RECOMMENDATIONS: Sostenes Erlich  Jaikob Ishimoto is a 77 y.o. male whose past medical history and cardiac risk factors include: Hx of renal cancer s/p partial nephrectomy, aortic atherosclerosis, infrarenal AAA (seen on CT abdomen and pelvis 10/13/2019), hyperlipidemia, right lower extremity DVT, former smoker, advanced age.  Infrarenal abdominal aortic aneurysm (AAA) without rupture [facility]) Results of the  aortic duplex from May 2024 reviewed with the patient. Will order repeat duplex for May 2025. He is already established care with vascular and vein given his varicosities he is more than welcome to either follow-up with myself or them going forward for AAA management.   Hypercholesterolemia Currently on simvastatin 80 mg p.o. nightly. Does not endorse myalgias. Lipid panel currently being monitored by PCP.  Nonrheumatic mitral valve regurgitation Repeat echocardiogram notes significant improvement in mitral regurgitation. Monitor clinically.    FINAL MEDICATION LIST END OF ENCOUNTER: No orders of the defined types were placed in this encounter.   Current Outpatient Medications:    Ascorbic Acid (VITAMIN C) 100  MG tablet, Take 100 mg by mouth daily., Disp: , Rfl:    aspirin 81 MG tablet, Take 81 mg by mouth daily., Disp: , Rfl:    buPROPion (WELLBUTRIN XL) 300 MG 24 hr tablet, Take 300 mg by mouth daily., Disp: , Rfl:    busPIRone (BUSPAR) 15 MG tablet, Take 15 mg by mouth 3 (three) times daily., Disp: , Rfl:    cyanocobalamin 100 MCG tablet, Take 100 mcg by mouth daily., Disp: , Rfl:    donepezil (ARICEPT) 10 MG tablet, Take 10 mg by mouth daily., Disp: , Rfl:    Multiple Vitamin (MULTIVITAMIN) tablet, Take 1 tablet by mouth daily., Disp: , Rfl:    Omega-3 Fatty Acids (FISH OIL) 1000 MG CAPS, Take by mouth., Disp: , Rfl:    simvastatin (ZOCOR) 80 MG tablet, Take 80 mg by mouth daily., Disp: , Rfl:    temazepam (RESTORIL) 30 MG capsule, Take 30 mg by mouth at bedtime., Disp: , Rfl:    Zinc Sulfate (ZINC 15 PO), Take by mouth., Disp: , Rfl:   Orders Placed This Encounter  Procedures   EKG 12-Lead   AAA Duplex   Patient's questions and concerns were addressed to his satisfaction. He voices understanding of the instructions provided during this encounter.   This note was created using a voice recognition software as a result there may be grammatical errors inadvertently enclosed that do not reflect the nature of this encounter. Every attempt is made to correct such errors.  Tessa Lerner, [place], [facility]  Pager:  [phone] Office: [phone]

## 2023-01-21 ENCOUNTER — Other Ambulatory Visit: Payer: Self-pay | Admitting: *Deleted

## 2023-01-21 DIAGNOSIS — I83812 Varicose veins of left lower extremities with pain: Secondary | ICD-10-CM

## 2023-01-21 MED ORDER — LORAZEPAM 1 MG PO TABS: ORAL_TABLET | ORAL | 0 refills | Status: AC

## 2023-01-27 ENCOUNTER — Encounter: Payer: Self-pay | Admitting: Vascular Surgery

## 2023-01-27 ENCOUNTER — Ambulatory Visit: Payer: Medicare Other | Admitting: Vascular Surgery

## 2023-01-27 VITALS — BP 129/68 | HR 65 | Temp 97.7°F | Resp 16 | Ht 70.0 in | Wt 175.0 lb

## 2023-01-27 DIAGNOSIS — I872 Venous insufficiency (chronic) (peripheral): Secondary | ICD-10-CM

## 2023-01-27 DIAGNOSIS — I83812 Varicose veins of left lower extremities with pain: Secondary | ICD-10-CM

## 2023-01-27 HISTORY — PX: ENDOVENOUS ABLATION SAPHENOUS VEIN W/ LASER: SUR449

## 2023-01-27 NOTE — Progress Notes (Signed)
     Laser Ablation Procedure    Date: 01/27/2023   Danny Torres DOB:02/29/1946  Consent signed: Yes      Surgeon: Cari Caraway MD   Procedure: Laser Ablation: left Greater Saphenous Vein  BP 129/68 (BP Location: Right Arm, Patient Position: Sitting, Cuff Size: Normal)   Pulse 65   Temp 97.7 F (36.5 C) (Temporal)   Resp 16   Ht 5\' 10"  (1.778 m)   Wt 175 lb (79.4 kg)   SpO2 97%   BMI 25.11 kg/m   Tumescent Anesthesia: 450 cc 0.9% NaCl with 50 cc Lidocaine HCL 1%  and 15 cc 8.4% NaHCO3  Local Anesthesia: 8 cc Lidocaine HCL and NaHCO3 (ratio 2:1)  7 watts continuous mode     Total energy: 1066 Joules    Total time: 152 seconds Treatment Length 25 cm   Laser Fiber Ref. #   [id number]   Lot #  [id number]   Stab Phlebectomy: >20 Sites: Thigh and Calf  Patient tolerated procedure well  Notes:  All staff members wore facial masks.  Spanish interpreter Vernona Rieger [phone] from mobile digital device [employer]) assisted with consents, procedure instructions and follow up instructions.  Mr. Jolon Kattner took Ativan 1 mg (2 tablets) on 01-27-2023 at 8:00 AM.    Description of Procedure:  After marking the course of the secondary varicosities, the patient was placed on the operating table in the supine position, and the left leg was prepped and draped in sterile fashion.   Local anesthetic was administered and under ultrasound guidance the saphenous vein was accessed with a micro needle and guide wire; then the mirco puncture sheath was placed.  A guide wire was inserted saphenofemoral junction , followed by a 5 french sheath.  The position of the sheath and then the laser fiber below the junction was confirmed using the ultrasound.  Tumescent anesthesia was administered along the course of the saphenous vein using ultrasound guidance. The patient was placed in Trendelenburg position and protective laser glasses were placed on patient and staff, and the laser was fired at 7 watts  continuous mode for a total of 1066 joules.   For stab phlebectomies, local anesthetic was administered at the previously marked varicosities, and tumescent anesthesia was administered around the vessels.  Greater than 20 stab wounds were made using the tip of an 11 blade. And using the vein hook, the phlebectomies were performed using a hemostat to avulse the varicosities.  Adequate hemostasis was achieved.     Steri strips were applied to the stab wounds and ABD pads and thigh high compression stockings were applied.  Ace wrap bandages were applied over the phlebectomy sites and at the top of the saphenofemoral junction. Blood loss was less than 15 cc.  Discharge instructions reviewed with patient and hardcopy of discharge instructions (Spanish) given to patient to take home. The patient ambulated out of the operating room having tolerated the procedure well.

## 2023-01-27 NOTE — Progress Notes (Signed)
Patient name: Danny Torres MRN: [id number] DOB: Jan 31, 1946 Sex: male  REASON FOR VISIT: For laser ablation of the left great saphenous vein greater than 20 stabs  HPI: Danny Torres is a 77 y.o. male who I last saw on 01/13/2023 with chronic venous insufficiency.  He has reflux in the left great saphenous vein down to the proximal calf and also reflux in the small saphenous vein.  He was having significant symptoms from venous hypertension despite conservative measures including leg elevation and thigh-high compression stockings.  I felt that we he would be a good candidate for laser ablation of the left great saphenous vein down to the distal thigh and greater than 20 stabs.  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  This reason I think this would be technically challenging to address.  Current Outpatient Medications  Medication Sig Dispense Refill   Ascorbic Acid (VITAMIN C) 100 MG tablet Take 100 mg by mouth daily.     aspirin 81 MG tablet Take 81 mg by mouth daily.     buPROPion (WELLBUTRIN XL) 300 MG 24 hr tablet Take 300 mg by mouth daily.     busPIRone (BUSPAR) 15 MG tablet Take 15 mg by mouth 3 (three) times daily.     cyanocobalamin 100 MCG tablet Take 100 mcg by mouth daily.     donepezil (ARICEPT) 10 MG tablet Take 10 mg by mouth daily.     LORazepam (ATIVAN) 1 MG tablet Take 2 tablets 30 minutes before leaving home on day of office surgery. 2 tablet 0   Multiple Vitamin (MULTIVITAMIN) tablet Take 1 tablet by mouth daily.     Omega-3 Fatty Acids (FISH OIL) 1000 MG CAPS Take by mouth.     simvastatin (ZOCOR) 80 MG tablet Take 80 mg by mouth daily.     temazepam (RESTORIL) 30 MG capsule Take 30 mg by mouth at bedtime.     Zinc Sulfate (ZINC 15 PO) Take by mouth.     No current  facility-administered medications for this visit.    PHYSICAL EXAM: Vitals:   01/27/23 0825  BP: 129/68  Pulse: 65  Resp: 16  Temp: 97.7 F (36.5 C)  TempSrc: Temporal  SpO2: 97%  Weight: 175 lb (79.4 kg)  Height: 5\' 10"  (1.778 m)    PROCEDURE: Laser ablation left great saphenous vein and greater than 20 stab phlebectomies  TECHNIQUE: The patient was taken to the exam room and the dilated varicose veins in the left leg were marked with the patient standing.  The patient was then placed supine.  I looked at the left great saphenous vein with the SonoSite and I felt that we could cannulate the vein in the distal thigh.  The left leg was prepped and draped in the usual sterile fashion.  Under ultrasound guidance, after the skin was anesthetized, I cannulated the left great saphenous vein in the distal thigh with a micropuncture needle and a micropuncture sheath was introduced over a wire.  I then advanced the J-wire to just below the saphenofemoral junction.  A 45 cm sheath was advanced over the wire and the wire and dilator removed.  The laser fiber was positioned at the end of the sheath and then the sheath retracted.  Laser fiber was positioned 2.5 cm  distal to the saphenofemoral junction.  Tumescent anesthesia was administered circumferentially around the vein.  Patient was placed in Trendelenburg.  Placed laser glasses on.  Laser ablation was performed of the left great saphenous vein from 2-1/2 cm distal to the saphenofemoral junction to the distal thigh.  50 J/cm was used at 7 W.  Next all of the marked areas were anesthetized with tumescent anesthesia.  Using approximately 25 small stab incisions the veins were hooked brought above the skin and then bluntly excised.  Pressure was held.  Steri-Strips were applied.  A pressure dressing was applied.  The patient tolerated the procedure well and will return in 2 weeks for follow-up ultrasound.  Waverly Ferrari [facility] [place] [phone]

## 2023-02-10 ENCOUNTER — Ambulatory Visit (HOSPITAL_COMMUNITY)
Admission: RE | Admit: 2023-02-10 | Discharge: 2023-02-10 | Disposition: A | Discharge status: Home / Self Care | Payer: Medicare Other | Source: Ambulatory Visit | Attending: Vascular Surgery | Admitting: Vascular Surgery

## 2023-02-10 ENCOUNTER — Encounter: Payer: Self-pay | Admitting: Vascular Surgery

## 2023-02-10 ENCOUNTER — Ambulatory Visit (INDEPENDENT_AMBULATORY_CARE_PROVIDER_SITE_OTHER): Payer: Medicare Other | Admitting: Vascular Surgery

## 2023-02-10 VITALS — BP 107/58 | HR 61 | Temp 97.6°F | Wt 170.0 lb

## 2023-02-10 DIAGNOSIS — I83812 Varicose veins of left lower extremities with pain: Secondary | ICD-10-CM

## 2023-02-10 DIAGNOSIS — I872 Venous insufficiency (chronic) (peripheral): Secondary | ICD-10-CM

## 2023-02-10 NOTE — Progress Notes (Signed)
Patient name: Danny Torres MRN: [id number] DOB: [birth year] Sex: male  REASON FOR VISIT: Follow-up after laser ablation of the left great saphenous vein and greater than 20 stabs.  HPI: Danny Torres is a 77 y.o. male who had presented with chronic venous insufficiency and significant symptoms from venous hypertension.  He had reflux in the left great saphenous vein.  He had multiple varicose veins.  On 01/27/2023 he underwent laser ablation of the left great saphenous vein from 2-1/2 cm distal to the saphenofemoral junction to the distal thigh.  He had greater than 20 stabs.  He comes in for a 2-week follow-up visit.  Since I saw him last, he denies any significant leg pain or bruising.  He denies chest pain or shortness of breath.  Current Outpatient Medications  Medication Sig Dispense Refill   Ascorbic Acid (VITAMIN C) 100 MG tablet Take 100 mg by mouth daily.     aspirin 81 MG tablet Take 81 mg by mouth daily.     buPROPion (WELLBUTRIN XL) 300 MG 24 hr tablet Take 300 mg by mouth daily.     busPIRone (BUSPAR) 15 MG tablet Take 15 mg by mouth 3 (three) times daily.     cyanocobalamin 100 MCG tablet Take 100 mcg by mouth daily.     donepezil (ARICEPT) 10 MG tablet Take 10 mg by mouth daily.     LORazepam (ATIVAN) 1 MG tablet Take 2 tablets 30 minutes before leaving home on day of office surgery. 2 tablet 0   Multiple Vitamin (MULTIVITAMIN) tablet Take 1 tablet by mouth daily.     Omega-3 Fatty Acids (FISH OIL) 1000 MG CAPS Take by mouth.     simvastatin (ZOCOR) 80 MG tablet Take 80 mg by mouth daily.     temazepam (RESTORIL) 30 MG capsule Take 30 mg by mouth at bedtime.     Zinc Sulfate (ZINC 15 PO) Take by mouth.     No current facility-administered medications for this visit.   REVIEW OF SYSTEMS: Arly.Keller ] denotes positive finding; [  ] denotes negative finding  CARDIOVASCULAR:  [ ]  chest pain   [ ]  dyspnea on exertion  [ ]  leg swelling  CONSTITUTIONAL:  [ ]  fever   [  ] chills  PHYSICAL EXAM: Vitals:   02/10/23 1031  BP: (!) 107/58  Pulse: 61  Temp: 97.6 F (36.4 C)  TempSrc: Temporal  SpO2: 98%  Weight: 170 lb (77.1 kg)   GENERAL: The patient is a well-nourished male, in no acute distress. The vital signs are documented above. CARDIOVASCULAR: There is a regular rate and rhythm. PULMONARY: There is good air exchange bilaterally without wheezing or rales. VASCULAR: He has no significant bruising.  DATA:  VENOUS DUPLEX: I have independently interpreted his venous duplex scan today.  There is no evidence of DVT in the left lower extremity.  The left great saphenous vein is successfully closed within 0.838 cm of the saphenofemoral junction.  MEDICAL ISSUES:  S/P LASER ABLATION LEFT GREAT SAPHENOUS VEIN AND GREATER THAN 20 STAB PHLEBECTOMIES: The patient is doing well and has no significant bruising or leg pain.  He has no significant swelling.  He is worn his thigh-high compression stockings for 2 weeks.  I have encouraged him to exercise and wear his compression stockings if he will be standing or sitting for a prolonged period of time such as when he is traveling.  We have also discussed the importance of daily leg elevation.  Overall happy with his result.  He will call if he develops any significant symptoms on the right.  Waverly Ferrari [facility] [phone]

## 2023-02-16 DIAGNOSIS — I714 Abdominal aortic aneurysm, without rupture, unspecified: Secondary | ICD-10-CM | POA: Diagnosis not present

## 2023-02-16 DIAGNOSIS — J8410 Pulmonary fibrosis, unspecified: Secondary | ICD-10-CM | POA: Diagnosis not present

## 2023-02-16 DIAGNOSIS — G4700 Insomnia, unspecified: Secondary | ICD-10-CM | POA: Diagnosis not present

## 2023-02-16 DIAGNOSIS — Z85528 Personal history of other malignant neoplasm of kidney: Secondary | ICD-10-CM | POA: Diagnosis not present

## 2023-02-16 DIAGNOSIS — I83813 Varicose veins of bilateral lower extremities with pain: Secondary | ICD-10-CM | POA: Diagnosis not present

## 2023-02-16 DIAGNOSIS — E782 Mixed hyperlipidemia: Secondary | ICD-10-CM | POA: Diagnosis not present

## 2023-02-16 DIAGNOSIS — Z0000 Encounter for general adult medical examination without abnormal findings: Secondary | ICD-10-CM | POA: Diagnosis not present

## 2023-02-16 DIAGNOSIS — H9190 Unspecified hearing loss, unspecified ear: Secondary | ICD-10-CM | POA: Diagnosis not present

## 2023-02-16 DIAGNOSIS — Z8639 Personal history of other endocrine, nutritional and metabolic disease: Secondary | ICD-10-CM | POA: Diagnosis not present

## 2023-02-16 DIAGNOSIS — E559 Vitamin D deficiency, unspecified: Secondary | ICD-10-CM | POA: Diagnosis not present

## 2023-03-11 ENCOUNTER — Ambulatory Visit: Payer: Medicare Other | Attending: Family Medicine | Admitting: Audiologist

## 2023-04-26 ENCOUNTER — Ambulatory Visit: Payer: Medicare Other | Attending: Family Medicine | Admitting: Audiologist

## 2023-04-26 DIAGNOSIS — H903 Sensorineural hearing loss, bilateral: Secondary | ICD-10-CM | POA: Insufficient documentation

## 2023-04-26 NOTE — Procedures (Signed)
  Outpatient Audiology and [facility] [address] [place], [place]  [address] [phone]  AUDIOLOGICAL  EVALUATION  NAME: [name]     DOB:   26-Apr-1946      MRN: [id number]                                                                                     DATE: 04/26/2023     REFERENT: Irven Coe, MD STATUS: Outpatient DIAGNOSIS: Sensorineural hearing loss bilaterally  History: Danny Torres was seen for an audiological evaluation due to concerns that he does not hear his own voice.  He feels that the way he speaks has changed and he sounds different.  This has been going on for more than a few months.  He is not sure when this change first started.  He said his wife feels he is not hearing well.  He denies any pain pressure or tinnitus in either ear.  He has no history of occupational noise exposure.  Medical history negative for any warning signs for hearing loss.   Evaluation:  Otoscopy showed a clear view of the tympanic membranes, bilaterally Tympanometry results were consistent with normal middle ear function bilaterally Audiometric testing was completed using Conventional Audiometry techniques with insert earphones and supra aural headphones. 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 Speech Recognition Thresholds were obtained at 30 dB HL in the right ear and at 25 dB HL in the left ear. Word Recognition Testing was completed at 40 dB SL and Ana scored 100% for both ears.  Results:  The test results were reviewed with [facility]  He has normal sloping to moderately severe sensorineural hearing loss bilaterally.  This sloping sensorineural loss is consistent with change related to age and noise exposure.  He cannot hear high-frequency consonants which would likely make him feel like his own voice sounds different.  Speech will sound muffled and unclear unless he can see the other person's face to  see the intended sound on their lips.   Recommendations: 1.   Hearing aids recommended for both years.  He was encouraged to call his insurance to check for coverage and was provided with 2 copies of his audiogram and a list of local hearing aid providers.   32 minutes spent testing and counseling on results.   If you have any questions please feel free to contact me at [phone].  Ammie Ferrier Au.D.  Audiologist   04/26/2023  1:32 PM  Cc: Irven Coe, MD

## 2023-08-03 DIAGNOSIS — I714 Abdominal aortic aneurysm, without rupture, unspecified: Secondary | ICD-10-CM | POA: Diagnosis not present

## 2023-08-03 DIAGNOSIS — Z85528 Personal history of other malignant neoplasm of kidney: Secondary | ICD-10-CM | POA: Diagnosis not present

## 2023-08-03 DIAGNOSIS — E559 Vitamin D deficiency, unspecified: Secondary | ICD-10-CM | POA: Diagnosis not present

## 2023-08-03 DIAGNOSIS — E782 Mixed hyperlipidemia: Secondary | ICD-10-CM | POA: Diagnosis not present

## 2023-08-23 DIAGNOSIS — H2513 Age-related nuclear cataract, bilateral: Secondary | ICD-10-CM | POA: Diagnosis not present

## 2023-08-23 DIAGNOSIS — H524 Presbyopia: Secondary | ICD-10-CM | POA: Diagnosis not present

## 2023-09-09 DIAGNOSIS — E782 Mixed hyperlipidemia: Secondary | ICD-10-CM | POA: Diagnosis not present

## 2023-09-09 DIAGNOSIS — I83813 Varicose veins of bilateral lower extremities with pain: Secondary | ICD-10-CM | POA: Diagnosis not present

## 2023-09-09 DIAGNOSIS — I714 Abdominal aortic aneurysm, without rupture, unspecified: Secondary | ICD-10-CM | POA: Diagnosis not present

## 2023-09-09 DIAGNOSIS — J8410 Pulmonary fibrosis, unspecified: Secondary | ICD-10-CM | POA: Diagnosis not present

## 2023-09-09 DIAGNOSIS — Z85528 Personal history of other malignant neoplasm of kidney: Secondary | ICD-10-CM | POA: Diagnosis not present

## 2023-09-09 DIAGNOSIS — E559 Vitamin D deficiency, unspecified: Secondary | ICD-10-CM | POA: Diagnosis not present

## 2023-09-09 DIAGNOSIS — G4700 Insomnia, unspecified: Secondary | ICD-10-CM | POA: Diagnosis not present

## 2023-09-14 ENCOUNTER — Encounter: Payer: Self-pay | Admitting: Pulmonary Disease

## 2023-11-16 DIAGNOSIS — H2513 Age-related nuclear cataract, bilateral: Secondary | ICD-10-CM | POA: Diagnosis not present

## 2023-11-16 DIAGNOSIS — H04123 Dry eye syndrome of bilateral lacrimal glands: Secondary | ICD-10-CM | POA: Diagnosis not present

## 2023-11-18 ENCOUNTER — Ambulatory Visit (HOSPITAL_COMMUNITY): Admission: RE | Admit: 2023-11-18 | Payer: Medicare Other | Source: Ambulatory Visit

## 2023-12-16 ENCOUNTER — Ambulatory Visit: Payer: Self-pay | Admitting: Cardiology

## 2023-12-28 DIAGNOSIS — Z85528 Personal history of other malignant neoplasm of kidney: Secondary | ICD-10-CM | POA: Diagnosis not present

## 2024-01-05 DIAGNOSIS — H2511 Age-related nuclear cataract, right eye: Secondary | ICD-10-CM | POA: Diagnosis not present

## 2024-01-05 DIAGNOSIS — H25811 Combined forms of age-related cataract, right eye: Secondary | ICD-10-CM | POA: Diagnosis not present

## 2024-01-10 DIAGNOSIS — G629 Polyneuropathy, unspecified: Secondary | ICD-10-CM | POA: Diagnosis not present

## 2024-01-21 ENCOUNTER — Ambulatory Visit: Attending: Cardiology | Admitting: Cardiology

## 2024-01-31 ENCOUNTER — Ambulatory Visit: Admitting: Pulmonary Disease

## 2024-01-31 DIAGNOSIS — R0600 Dyspnea, unspecified: Secondary | ICD-10-CM

## 2024-01-31 DIAGNOSIS — R918 Other nonspecific abnormal finding of lung field: Secondary | ICD-10-CM

## 2024-02-08 ENCOUNTER — Ambulatory Visit: Admitting: Cardiology

## 2024-02-14 DIAGNOSIS — G629 Polyneuropathy, unspecified: Secondary | ICD-10-CM | POA: Diagnosis not present

## 2024-02-15 DIAGNOSIS — I714 Abdominal aortic aneurysm, without rupture, unspecified: Secondary | ICD-10-CM | POA: Diagnosis not present

## 2024-02-15 DIAGNOSIS — E559 Vitamin D deficiency, unspecified: Secondary | ICD-10-CM | POA: Diagnosis not present

## 2024-02-15 DIAGNOSIS — Z85528 Personal history of other malignant neoplasm of kidney: Secondary | ICD-10-CM | POA: Diagnosis not present

## 2024-02-15 DIAGNOSIS — Z8639 Personal history of other endocrine, nutritional and metabolic disease: Secondary | ICD-10-CM | POA: Diagnosis not present

## 2024-02-15 DIAGNOSIS — E782 Mixed hyperlipidemia: Secondary | ICD-10-CM | POA: Diagnosis not present

## 2024-02-17 DIAGNOSIS — E782 Mixed hyperlipidemia: Secondary | ICD-10-CM | POA: Diagnosis not present

## 2024-02-17 DIAGNOSIS — E559 Vitamin D deficiency, unspecified: Secondary | ICD-10-CM | POA: Diagnosis not present

## 2024-02-17 DIAGNOSIS — Z85528 Personal history of other malignant neoplasm of kidney: Secondary | ICD-10-CM | POA: Diagnosis not present

## 2024-02-17 DIAGNOSIS — Z0000 Encounter for general adult medical examination without abnormal findings: Secondary | ICD-10-CM | POA: Diagnosis not present

## 2024-02-17 DIAGNOSIS — I714 Abdominal aortic aneurysm, without rupture, unspecified: Secondary | ICD-10-CM | POA: Diagnosis not present

## 2024-02-17 DIAGNOSIS — Z8639 Personal history of other endocrine, nutritional and metabolic disease: Secondary | ICD-10-CM | POA: Diagnosis not present

## 2024-02-23 ENCOUNTER — Ambulatory Visit: Admitting: Cardiology

## 2024-02-25 ENCOUNTER — Ambulatory Visit: Attending: Cardiology | Admitting: Cardiology

## 2024-03-20 ENCOUNTER — Ambulatory Visit: Admitting: Pulmonary Disease

## 2024-03-20 ENCOUNTER — Encounter: Payer: Self-pay | Admitting: Pulmonary Disease

## 2024-03-20 VITALS — BP 126/72 | HR 61 | Temp 98.1°F | Ht 70.0 in | Wt 178.0 lb

## 2024-03-20 DIAGNOSIS — R918 Other nonspecific abnormal finding of lung field: Secondary | ICD-10-CM | POA: Diagnosis not present

## 2024-03-20 NOTE — Patient Instructions (Signed)
  VISIT SUMMARY: You came in for a follow-up on your pulmonary condition. Your breathing is good, and you remain physically active without any current respiratory complaints. We discussed your previous CT scans and decided to order a new chest CT to check the status of your pulmonary nodules.  YOUR PLAN: PULMONARY NODULES (CALCIFIED GRANULOMAS): You have calcified granulomas in your lungs, which are likely from an old infection. Your previous CT scans showed stable nodules, and you are currently asymptomatic with good lung function. -Order a chest CT to assess the current status of your pulmonary nodules. -We will review the CT results and communicate the findings to you via telephone or MyChart. -If the CT scan shows stable nodules, no further follow-up is necessary. -If the CT scan shows changes, we will schedule a follow-up appointment.
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
## 2024-03-22 DIAGNOSIS — H2512 Age-related nuclear cataract, left eye: Secondary | ICD-10-CM | POA: Diagnosis not present

## 2024-03-22 DIAGNOSIS — H25812 Combined forms of age-related cataract, left eye: Secondary | ICD-10-CM | POA: Diagnosis not present

## 2024-04-06 ENCOUNTER — Other Ambulatory Visit

## 2024-05-29 ENCOUNTER — Inpatient Hospital Stay: Admission: RE | Admit: 2024-05-29 | Discharge: 2024-05-29 | Attending: Pulmonary Disease

## 2024-05-29 DIAGNOSIS — R918 Other nonspecific abnormal finding of lung field: Secondary | ICD-10-CM

## 2024-06-16 ENCOUNTER — Ambulatory Visit: Payer: Self-pay | Admitting: Pulmonary Disease

## 2024-06-29 ENCOUNTER — Ambulatory Visit: Admitting: Pulmonary Disease

## 2024-07-18 ENCOUNTER — Ambulatory Visit: Admitting: Pulmonary Disease

## 2024-08-09 ENCOUNTER — Ambulatory Visit: Admitting: Pulmonary Disease

## 2024-09-01 ENCOUNTER — Ambulatory Visit: Admitting: Cardiology
